# Patient Record
Sex: Female | Born: 1935 | Race: White | Hispanic: No | Marital: Married | Smoking: Former smoker
Health system: Southern US, Community
[De-identification: ages and names within clinical notes are randomized; demographics above are authoritative.]

## PROBLEM LIST (undated history)

## (undated) DIAGNOSIS — F02818 Dementia in other diseases classified elsewhere, unspecified severity, with other behavioral disturbance: Secondary | ICD-10-CM

## (undated) DIAGNOSIS — G309 Alzheimer's disease, unspecified: Secondary | ICD-10-CM

## (undated) DIAGNOSIS — F0280 Dementia in other diseases classified elsewhere without behavioral disturbance: Secondary | ICD-10-CM

## (undated) DIAGNOSIS — F0281 Dementia in other diseases classified elsewhere with behavioral disturbance: Secondary | ICD-10-CM

## (undated) DIAGNOSIS — R609 Edema, unspecified: Secondary | ICD-10-CM

## (undated) DIAGNOSIS — F329 Major depressive disorder, single episode, unspecified: Secondary | ICD-10-CM

## (undated) DIAGNOSIS — I509 Heart failure, unspecified: Secondary | ICD-10-CM

## (undated) DIAGNOSIS — I10 Essential (primary) hypertension: Secondary | ICD-10-CM

## (undated) DIAGNOSIS — F32A Depression, unspecified: Secondary | ICD-10-CM

## (undated) HISTORY — DX: Heart failure, unspecified: I50.9

## (undated) HISTORY — DX: Alzheimer's disease, unspecified: G30.9

## (undated) HISTORY — DX: Dementia in other diseases classified elsewhere, unspecified severity, without behavioral disturbance, psychotic disturbance, mood disturbance, and anxiety: F02.80

## (undated) HISTORY — DX: Depression, unspecified: F32.A

## (undated) HISTORY — DX: Edema, unspecified: R60.9

## (undated) HISTORY — DX: Essential (primary) hypertension: I10

## (undated) HISTORY — DX: Dementia in other diseases classified elsewhere with behavioral disturbance: F02.81

## (undated) HISTORY — DX: Major depressive disorder, single episode, unspecified: F32.9

## (undated) HISTORY — DX: Dementia in other diseases classified elsewhere, unspecified severity, with other behavioral disturbance: F02.818

---

## 1999-11-23 ENCOUNTER — Encounter (HOSPITAL_BASED_OUTPATIENT_CLINIC_OR_DEPARTMENT_OTHER): Payer: Self-pay | Admitting: Internal Medicine

## 1999-11-23 ENCOUNTER — Encounter: Admission: RE | Admit: 1999-11-23 | Discharge: 1999-11-23 | Payer: Self-pay | Admitting: Internal Medicine

## 2001-06-12 ENCOUNTER — Encounter: Admission: RE | Admit: 2001-06-12 | Discharge: 2001-06-12 | Payer: Self-pay | Admitting: Internal Medicine

## 2001-06-12 ENCOUNTER — Encounter: Payer: Self-pay | Admitting: Internal Medicine

## 2001-12-11 ENCOUNTER — Encounter (INDEPENDENT_AMBULATORY_CARE_PROVIDER_SITE_OTHER): Payer: Self-pay | Admitting: *Deleted

## 2001-12-11 ENCOUNTER — Encounter: Payer: Self-pay | Admitting: Internal Medicine

## 2001-12-11 ENCOUNTER — Ambulatory Visit (HOSPITAL_COMMUNITY): Admission: RE | Admit: 2001-12-11 | Discharge: 2001-12-11 | Payer: Self-pay | Admitting: Internal Medicine

## 2002-08-13 ENCOUNTER — Encounter: Admission: RE | Admit: 2002-08-13 | Discharge: 2002-08-13 | Payer: Self-pay | Admitting: Internal Medicine

## 2002-08-13 ENCOUNTER — Encounter: Payer: Self-pay | Admitting: Internal Medicine

## 2002-11-14 ENCOUNTER — Inpatient Hospital Stay (HOSPITAL_COMMUNITY): Admission: EM | Admit: 2002-11-14 | Discharge: 2002-11-16 | Payer: Self-pay | Admitting: Emergency Medicine

## 2002-11-14 ENCOUNTER — Encounter: Payer: Self-pay | Admitting: Emergency Medicine

## 2002-11-15 ENCOUNTER — Encounter: Payer: Self-pay | Admitting: Internal Medicine

## 2003-06-16 ENCOUNTER — Ambulatory Visit (HOSPITAL_COMMUNITY): Admission: RE | Admit: 2003-06-16 | Discharge: 2003-06-16 | Payer: Self-pay | Admitting: Internal Medicine

## 2003-09-09 ENCOUNTER — Encounter: Admission: RE | Admit: 2003-09-09 | Discharge: 2003-09-09 | Payer: Self-pay | Admitting: Internal Medicine

## 2004-11-09 ENCOUNTER — Encounter: Admission: RE | Admit: 2004-11-09 | Discharge: 2004-11-09 | Payer: Self-pay | Admitting: Internal Medicine

## 2005-09-13 ENCOUNTER — Emergency Department (HOSPITAL_COMMUNITY): Admission: EM | Admit: 2005-09-13 | Discharge: 2005-09-13 | Payer: Self-pay | Admitting: Emergency Medicine

## 2005-11-11 ENCOUNTER — Encounter: Admission: RE | Admit: 2005-11-11 | Discharge: 2005-11-11 | Payer: Self-pay | Admitting: Internal Medicine

## 2006-11-15 ENCOUNTER — Encounter: Admission: RE | Admit: 2006-11-15 | Discharge: 2006-11-15 | Payer: Self-pay | Admitting: Internal Medicine

## 2007-12-19 ENCOUNTER — Encounter: Admission: RE | Admit: 2007-12-19 | Discharge: 2007-12-19 | Payer: Self-pay | Admitting: Internal Medicine

## 2009-05-13 ENCOUNTER — Ambulatory Visit: Payer: Self-pay | Admitting: Internal Medicine

## 2009-05-13 ENCOUNTER — Telehealth: Payer: Self-pay | Admitting: Internal Medicine

## 2009-05-13 DIAGNOSIS — Z860100 Personal history of colon polyps, unspecified: Secondary | ICD-10-CM | POA: Insufficient documentation

## 2009-05-13 DIAGNOSIS — D509 Iron deficiency anemia, unspecified: Secondary | ICD-10-CM

## 2010-06-03 ENCOUNTER — Ambulatory Visit: Payer: Self-pay | Admitting: Cardiology

## 2010-06-03 ENCOUNTER — Inpatient Hospital Stay (HOSPITAL_COMMUNITY): Admission: EM | Admit: 2010-06-03 | Discharge: 2010-06-10 | Payer: Self-pay | Admitting: Emergency Medicine

## 2010-06-04 ENCOUNTER — Encounter (INDEPENDENT_AMBULATORY_CARE_PROVIDER_SITE_OTHER): Payer: Self-pay | Admitting: Family Medicine

## 2010-06-08 DIAGNOSIS — F0281 Dementia in other diseases classified elsewhere with behavioral disturbance: Secondary | ICD-10-CM

## 2010-08-30 ENCOUNTER — Encounter: Payer: Self-pay | Admitting: Internal Medicine

## 2010-10-19 LAB — CBC
HCT: 34.1 % — ABNORMAL LOW (ref 36.0–46.0)
MCHC: 34.3 g/dL (ref 30.0–36.0)
MCV: 95.9 fL (ref 78.0–100.0)
RDW: 16.5 % — ABNORMAL HIGH (ref 11.5–15.5)

## 2010-10-19 LAB — BASIC METABOLIC PANEL
BUN: 14 mg/dL (ref 6–23)
Chloride: 101 mEq/L (ref 96–112)
Glucose, Bld: 95 mg/dL (ref 70–99)
Potassium: 3.8 mEq/L (ref 3.5–5.1)

## 2010-10-20 LAB — BLOOD GAS, ARTERIAL
Acid-Base Excess: 8.2 mmol/L — ABNORMAL HIGH (ref 0.0–2.0)
Bicarbonate: 32.8 mEq/L — ABNORMAL HIGH (ref 20.0–24.0)
O2 Saturation: 93.8 %
pO2, Arterial: 62.9 mmHg — ABNORMAL LOW (ref 80.0–100.0)

## 2010-10-20 LAB — TSH: TSH: 0.067 u[IU]/mL — ABNORMAL LOW (ref 0.350–4.500)

## 2010-10-20 LAB — BASIC METABOLIC PANEL
CO2: 31 mEq/L (ref 19–32)
CO2: 32 mEq/L (ref 19–32)
CO2: 33 mEq/L — ABNORMAL HIGH (ref 19–32)
CO2: 35 mEq/L — ABNORMAL HIGH (ref 19–32)
Calcium: 8 mg/dL — ABNORMAL LOW (ref 8.4–10.5)
Calcium: 8.1 mg/dL — ABNORMAL LOW (ref 8.4–10.5)
Calcium: 8.6 mg/dL (ref 8.4–10.5)
Chloride: 106 mEq/L (ref 96–112)
Chloride: 106 mEq/L (ref 96–112)
Chloride: 108 mEq/L (ref 96–112)
Creatinine, Ser: 0.73 mg/dL (ref 0.4–1.2)
Creatinine, Ser: 0.75 mg/dL (ref 0.4–1.2)
Creatinine, Ser: 0.79 mg/dL (ref 0.4–1.2)
GFR calc Af Amer: >60 mL/min (ref >60)
GFR calc Af Amer: >60 mL/min (ref >60)
GFR calc Af Amer: >60 mL/min (ref >60)
GFR calc Af Amer: >60 mL/min (ref >60)
GFR calc Af Amer: >60 mL/min (ref >60)
GFR calc non Af Amer: >60 mL/min (ref >60)
Potassium: 4 mEq/L (ref 3.5–5.1)
Potassium: 4.1 mEq/L (ref 3.5–5.1)
Sodium: 141 mEq/L (ref 135–145)
Sodium: 143 mEq/L (ref 135–145)
Sodium: 144 mEq/L (ref 135–145)

## 2010-10-20 LAB — CBC
HCT: 35.4 % — ABNORMAL LOW (ref 36.0–46.0)
Hemoglobin: 11.8 g/dL — ABNORMAL LOW (ref 12.0–15.0)
Hemoglobin: 12 g/dL (ref 12.0–15.0)
Hemoglobin: 12.1 g/dL (ref 12.0–15.0)
Hemoglobin: 12.7 g/dL (ref 12.0–15.0)
MCH: 32.6 pg (ref 26.0–34.0)
MCH: 32.6 pg (ref 26.0–34.0)
MCH: 32.8 pg (ref 26.0–34.0)
MCH: 33 pg (ref 26.0–34.0)
MCV: 96.4 fL (ref 78.0–100.0)
MCV: 96.6 fL (ref 78.0–100.0)
Platelets: 241 10*3/uL (ref 150–400)
Platelets: 265 10*3/uL (ref 150–400)
Platelets: 280 10*3/uL (ref 150–400)
Platelets: 281 10*3/uL (ref 150–400)
RBC: 3.58 MIL/uL — ABNORMAL LOW (ref 3.87–5.11)
RBC: 3.64 MIL/uL — ABNORMAL LOW (ref 3.87–5.11)
RBC: 3.68 MIL/uL — ABNORMAL LOW (ref 3.87–5.11)
RBC: 3.71 MIL/uL — ABNORMAL LOW (ref 3.87–5.11)
RBC: 3.87 MIL/uL (ref 3.87–5.11)
RDW: 16.4 % — ABNORMAL HIGH (ref 11.5–15.5)
WBC: 10.1 10*3/uL (ref 4.0–10.5)
WBC: 11.8 10*3/uL — ABNORMAL HIGH (ref 4.0–10.5)
WBC: 21.4 10*3/uL — ABNORMAL HIGH (ref 4.0–10.5)

## 2010-10-20 LAB — PROTIME-INR
INR: 1.27 (ref 0.00–1.49)
INR: 1.38 (ref 0.00–1.49)
Prothrombin Time: 16.1 seconds — ABNORMAL HIGH (ref 11.6–15.2)
Prothrombin Time: 17.2 seconds — ABNORMAL HIGH (ref 11.6–15.2)

## 2010-10-20 LAB — DIFFERENTIAL
Basophils Absolute: 0 10*3/uL (ref 0.0–0.1)
Eosinophils Absolute: 0 10*3/uL (ref 0.0–0.7)
Lymphocytes Relative: 4 % — ABNORMAL LOW (ref 12–46)
Monocytes Absolute: 1.7 10*3/uL — ABNORMAL HIGH (ref 0.1–1.0)
Neutrophils Relative %: 88 % — ABNORMAL HIGH (ref 43–77)

## 2010-10-20 LAB — BRAIN NATRIURETIC PEPTIDE
Pro B Natriuretic peptide (BNP): 108 pg/mL — ABNORMAL HIGH (ref 0.0–100.0)
Pro B Natriuretic peptide (BNP): 321 pg/mL — ABNORMAL HIGH (ref 0.0–100.0)

## 2010-10-20 LAB — CULTURE, BLOOD (ROUTINE X 2): Culture  Setup Time: 201110280206

## 2010-10-20 LAB — POCT CARDIAC MARKERS
CKMB, poc: 3.2 ng/mL (ref 1.0–8.0)
Myoglobin, poc: 72.4 ng/mL (ref 12–200)
Troponin i, poc: <0.05 ng/mL (ref 0.00–0.09)

## 2010-10-20 LAB — COMPREHENSIVE METABOLIC PANEL
AST: 16 U/L (ref 0–37)
Albumin: 2.3 g/dL — ABNORMAL LOW (ref 3.5–5.2)
Calcium: 8.1 mg/dL — ABNORMAL LOW (ref 8.4–10.5)
Chloride: 105 mEq/L (ref 96–112)
Creatinine, Ser: 0.78 mg/dL (ref 0.4–1.2)
GFR calc Af Amer: >60 mL/min (ref >60)
Total Protein: 5.6 g/dL — ABNORMAL LOW (ref 6.0–8.3)

## 2010-10-20 LAB — POCT I-STAT, CHEM 8
Chloride: 95 mEq/L — ABNORMAL LOW (ref 96–112)
Creatinine, Ser: 1.1 mg/dL (ref 0.4–1.2)
Glucose, Bld: 94 mg/dL (ref 70–99)
HCT: 41 % (ref 36.0–46.0)
Potassium: 2.7 mEq/L — CL (ref 3.5–5.1)
Sodium: 140 mEq/L (ref 135–145)

## 2010-10-20 LAB — MAGNESIUM: Magnesium: 2.2 mg/dL (ref 1.5–2.5)

## 2010-10-20 LAB — PROCALCITONIN: Procalcitonin: 0.24 ng/mL

## 2010-12-24 NOTE — H&P (Signed)
NAME:  PARI, LOMBARD NO.:  [id number]   MEDICAL RECORD NO.:  [id number]                   PATIENT TYPE:  EMS   LOCATION:  [facility]                                 FACILITY:  [facility]   PHYSICIAN:  Gwen Pounds, M.D.                 DATE OF BIRTH:  1926/10/02   DATE OF ADMISSION:  11/14/2002  DATE OF DISCHARGE:                                HISTORY & PHYSICAL   CHIEF COMPLAINT:  Shortness of breath and tachypnea.   HISTORY OF PRESENT ILLNESS:  [age]-year-old female with history of  pneumonia and a greater than 50-pack-year tobacco history, developed cough,  upper respiratory infection over the last couple of days, no sputum, and  just got progressively short of breath over the course of this afternoon.  She described chest congestion and rattly airway noises and shortness of  breath.  It got worse approximately 10:30 p.m.  She went in the bathroom,  closed the door, turned the shower on and took the steam and felt like her  breathing was getting slightly better.  They called me around 12:00 or 12:30  with the above-mentioned symptoms and I instructed them to call 911.  EMS  came out, noticed that she was hypoxic, placed her on oxygen and brought her  to the emergency department.  In the ED, she was diagnosed with COPD versus  early pneumonia.  Chest x-ray revealed infiltrate versus atelectasis.  Oxygen saturations were 74% on arrival.  Her face mask is currently off and  lying by her cheek and her oxygen saturation in the mid-80s.  She denies any  other medical issues at the current time.  I was called for admission.   PAST MEDICAL HISTORY:  Hyperlipidemia, hypertension, anxiety, depression,  osteoporosis and history of pneumonia.   MEDICATIONS:  Medications include Lipitor, Cozaar, Zoloft, triazolam and  Fosamax.   ALLERGIES:  MORPHINE and CODEINE.   SOCIAL HISTORY:  No tobacco in the last 10 years but has greater than a 50-  year tobacco  history.  She lives with her husband.   FAMILY HISTORY:  Mother died of subarachnoid hemorrhage.  Father died of war  wounds.   REVIEW OF SYSTEMS:  She denies any chest pain.  Her shortness of breath is  slightly better now after getting some O2 but she is still labored.  She  initially says that she had been having orthopnea, dyspnea on exertion, no  abdominal pain, no trouble with her bowels or bladder, no trouble with ear,  nose and throat, no other chest or lung issues, no other cardiac issues, no  trouble walking or talking, no other issues from head to toe.   PHYSICAL EXAMINATION:  VITALS:  Temperature 99.9, heart rate 98, blood  pressure 170/73, respiratory rate 28, room air saturations 74%, 86% after  wearing oxygen for a little while.  GENERAL:  Mild  respiratory distress, sitting up with tachypnea and some  labored breathing, using accessory muscles to breathe.  EENT:  PERRL.  EOMI.  Oropharynx moist.  NECK:  Neck without JVD.  No lymphadenopathy noted.  CARDIAC:  Regular without murmurs.  PULMONARY:  Diffuse rhonchi and wheezing throughout, audible from across the  room.  She is pretty tachypneic.  ABDOMEN:  Abdomen is soft.  EXTREMITIES:  Trace edema and varicose veins.   ANCILLARY DATA:  EKG shows normal sinus rhythm, first-degree A-V block and  artifact noted.   Chest x-ray:  Right lower lobe atelectasis versus infiltrate.   PCO2 65, PO2 116, pH of 7.32, saturating 98% on non-breather mask.  Sodium  140, potassium 3.2, chloride 105, bicarb 30, BUN 18, creatinine 0.6, glucose  120.  Her LFTs are fine.  WBC count is 14.1, hemoglobin 14.1, platelet count  191,000.  CK 43, troponin I 0.02, BNP 43.   ASSESSMENT:  This is an elderly female being admitted with a chronic  obstructive pulmonary disease exacerbation or pneumonia and hypoxic  respiratory failure.   PLAN:  1. Admit.  2. Oxygen to keep the oxygen saturations greater than 92%.  3. Nebulizers and antibiotics.   See order sheet.  4. We will place her on IV, then oral steroids for the COPD exacerbation.  5. We will treat underlying hypertension.  We will increase the Cozaar at     this current time.  6. We will treat the underlying hypokalemia with IV potassium in her normal     saline.  7. We will place on DVT prophylaxis.  8. We will check CBGs on a twice-daily basis secondary to the high steroids     and I will instruct the nurses to call for sugars greater than 200 and to     be started on insulin sliding scale.  9. First CK and troponins are negative.  I doubt this is cardiac in nature,     but we will check one more CK and troponin at this current time.                                               Gwen Pounds, M.D.    JMR/MEDQ  D:  11/14/2002  T:  11/15/2002  Job:  [id number]   cc:   Gaspar Garbe, M.D.  [address]  [place]  [place] [address]  Fax: [phone]

## 2010-12-24 NOTE — Discharge Summary (Signed)
NAME:  Patricia Roberson, FEHRING                             ACCOUNT NO.:  [id number]   MEDICAL RECORD NO.:  [id number]                   PATIENT TYPE:  INP   LOCATION:  4732                                 FACILITY:  [facility]   PHYSICIAN:  Gaspar Garbe, M.D.            DATE OF BIRTH:  [birth year]   DATE OF ADMISSION:  11/14/2002  DATE OF DISCHARGE:  11/16/2002                                 DISCHARGE SUMMARY   DIAGNOSES:  1. Chronic obstructive pulmonary disease exacerbation.  2. Abnormal cardiac enzymes.  3. Hypertension.  4. Hyperlipidemia.   MEDICATIONS:  1. Lipitor 20 mg 1/2 tablet daily.  2. Cozaar 50 mg p.o. daily.  3. Zoloft 50 mg p.o. daily.  4. Halcion p.r.n. #30.  5. Fosamax 70 mg p.o. weekly.   NEW MEDICATIONS:  1. Cannula oxygen at 2 L a minute.  2. Prednisone 20 mg 2 tablets x4 days and 1 tablet x6 days.  3. DuoNeb q.i.d.  4. Augmentin 875 mg p.o. b.i.d. x10 total days.  5. Over-the-counter Prilosec 20 mg daily x2 weeks.   HOSPITAL COURSE:  The patient was admitted on November 14, 2002 and found to be  in acute respiratory distress with oxygen saturations in the low 90s on 4-5  L.  The patient was given around-the-clock nebulizers every four hours and  started on Solu-Medrol IV after being seen by Dr. Timothy Lasso in the emergency  room.  In addition, the patient was put on Rocephin and azithromycin, which  was continued during the course of her hospital stay.  The patient was seen  and thought to be breathing considerably better the morning of April 9.  Her  prednisone was continued and her breathing treatments were changed to p.r.n.  only.  The patient continued to have a bit of an oxygen requirement on 2 L  which continued during the rest of the following day.  The patient was also  found to have a troponin at 0.2 and stayed between 0.1 and 0.12 after being  negative in the emergency room.  The patient was consulted by Dr. Ronny Flurry, who elected to see the patient as an  outpatient for adenosine  Cardiolite, as her COPD exacerbation was much more problematic at that time.   SPECIAL INSTRUCTIONS:  The patient is to have home health R.N. see her  regarding evaluation of pneumonia and COPD.  She was given an appointment  within one week of her discharge with Dr. Wylene Simmer and is to call Dr.  Yevonne Pax office after her visit with Dr. Wylene Simmer to arrange for an  adenosine Cardiolite the week of November 25, 2002.    DISCHARGE LABORATORY DATA:  White count 12.7, hemoglobin 12, hematocrit 36,  platelets 211.  D-dimer was negative.  The patient had a CT scan of her  chest which did not show any evidence of pulmonary embolus.  Her BUN and  creatinine were 13 and 0.5 at the time of discharge.                                               Gaspar Garbe, M.D.    RWT/MEDQ  D:  11/18/2002  T:  11/18/2002  Job:  [id number]

## 2010-12-24 NOTE — Consult Note (Signed)
NAME:  Patricia Roberson, Patricia Roberson NO.:  [id number]   MEDICAL RECORD NO.:  [id number]                   PATIENT TYPE:  INP   LOCATION:  4732                                 FACILITY:  [facility]   PHYSICIAN:  Cassell Clement, M.D.              DATE OF BIRTH:  06-10-1927   DATE OF CONSULTATION:  11/15/2002  DATE OF DISCHARGE:                                   CONSULTATION   CARDIOLOGY CONSULTATION   HISTORY OF PRESENT ILLNESS:  This is a 77 year old married Caucasian woman  admitted with marked dyspnea and wheezing on the morning of November 14, 2002.  This was not associated with any chills, fever or purulent sputum.  She did  not have any hemoptysis.  She had no chest pain or pleuritic pain.  She was  noted to be wheezing audibly from across the room on admission and was quite  hypoxic and in respiratory distress.  Interestingly she has had similar  acute respiratory problems in two previous years requiring admission  elsewhere while visiting her son on 67.  The patient's hospital  course so far has been smooth.  She has improved rapidly with pulmonary  toilet, nebulizers, antibiotics and steroids.  Her electrocardiogram's have  shown poor R wave progression but no evidence of any ischemic ST-T  abnormalities.  She has risk factors for coronary disease including prior  history of hypertension, prior history of hypercholesterolemia, prior  smoking history.  She gets very little exercise and is quite sedentary.   FAMILY HISTORY:  Negative for premature coronary artery disease.  Her father  died in World War II in combat and her mother died of a massive cerebral  hemorrhage.   MEDICATIONS AT HOME:  Include Lipitor, Cozaar, Zoloft, triazolam and  Fosamax.   PHYSICAL EXAMINATION:  VITAL SIGNS:  Examination reveals blood pressure  114/48, pulse 72 and regular, respirations are normal and she is no longer  requiring oxygen.  HEENT:  Head and neck examination  reveals the jugular venous pressure is  normal .  The carotids are normal.  CHEST:  Clear.  HEART:  Regular sinus rhythm without murmurs, gallops, rubs or clicks.  ABDOMEN:  Soft, nontender.  EXTREMITIES:  Show 1+ pedal pulses, no phlebitis or edema.   LABORATORY DATA:  Her labs include troponin's of 0.02, 0.21, 0.10 and 0.12.  Her CK-MB's are 2.2 and 4.1.  Her electrocardiogram shows no acute change.   IMPRESSION:  I suspect that the slight rise in cardiac enzymes represent a  false positive enzyme rise secondary to her acute exacerbation of chronic  obstructive pulmonary disease with asthma.  I don't find any evidence of any  myocardial injury as a primary event.   RECOMMENDATIONS:  Recommend completion of work up with the spiral CT scan of  the chest to rule out pulmonary emboli as you have ordered.  I don't believe  any further inpatient cardiac tests are necessary.  She should have an  outpatient adenosine Cardiolite stress test which we can do in the office  once she has cleared her acute  respiratory exacerbation further.  I would anticipate we could probably do  the adenosine Cardiolite stress test the week of November 25, 2002.  She will  call our office after her discharge to arrange this.   Thank you for asking me to see this pleasant woman with you.                                               Cassell Clement, M.D.    TB/MEDQ  D:  11/15/2002  T:  11/15/2002  Job:  [id number]

## 2011-03-05 ENCOUNTER — Emergency Department (HOSPITAL_COMMUNITY): Payer: Medicare Other

## 2011-03-05 ENCOUNTER — Inpatient Hospital Stay (HOSPITAL_COMMUNITY)
Admission: EM | Admit: 2011-03-05 | Discharge: 2011-03-07 | DRG: 087 | Disposition: A | Discharge status: Skilled Nursing Facility | Payer: Medicare Other | Attending: General Surgery | Admitting: General Surgery

## 2011-03-05 DIAGNOSIS — S0100XA Unspecified open wound of scalp, initial encounter: Secondary | ICD-10-CM

## 2011-03-05 DIAGNOSIS — W19XXXA Unspecified fall, initial encounter: Secondary | ICD-10-CM | POA: Diagnosis present

## 2011-03-05 DIAGNOSIS — F329 Major depressive disorder, single episode, unspecified: Secondary | ICD-10-CM | POA: Diagnosis present

## 2011-03-05 DIAGNOSIS — J449 Chronic obstructive pulmonary disease, unspecified: Secondary | ICD-10-CM | POA: Diagnosis present

## 2011-03-05 DIAGNOSIS — I10 Essential (primary) hypertension: Secondary | ICD-10-CM | POA: Diagnosis present

## 2011-03-05 DIAGNOSIS — J4489 Other specified chronic obstructive pulmonary disease: Secondary | ICD-10-CM | POA: Diagnosis present

## 2011-03-05 DIAGNOSIS — F0280 Dementia in other diseases classified elsewhere without behavioral disturbance: Secondary | ICD-10-CM | POA: Diagnosis present

## 2011-03-05 DIAGNOSIS — G309 Alzheimer's disease, unspecified: Secondary | ICD-10-CM | POA: Diagnosis present

## 2011-03-05 DIAGNOSIS — R319 Hematuria, unspecified: Secondary | ICD-10-CM | POA: Diagnosis present

## 2011-03-05 DIAGNOSIS — Y9210 Unspecified residential institution as the place of occurrence of the external cause: Secondary | ICD-10-CM | POA: Diagnosis present

## 2011-03-05 DIAGNOSIS — F3289 Other specified depressive episodes: Secondary | ICD-10-CM | POA: Diagnosis present

## 2011-03-05 DIAGNOSIS — S069X9A Unspecified intracranial injury with loss of consciousness of unspecified duration, initial encounter: Secondary | ICD-10-CM

## 2011-03-05 DIAGNOSIS — D649 Anemia, unspecified: Secondary | ICD-10-CM | POA: Diagnosis present

## 2011-03-05 DIAGNOSIS — N200 Calculus of kidney: Secondary | ICD-10-CM | POA: Diagnosis present

## 2011-03-05 DIAGNOSIS — S066X0A Traumatic subarachnoid hemorrhage without loss of consciousness, initial encounter: Principal | ICD-10-CM | POA: Diagnosis present

## 2011-03-05 DIAGNOSIS — M1990 Unspecified osteoarthritis, unspecified site: Secondary | ICD-10-CM | POA: Diagnosis present

## 2011-03-05 LAB — COMPREHENSIVE METABOLIC PANEL
ALT: 11 U/L (ref 0–35)
AST: 13 U/L (ref 0–37)
Calcium: 10 mg/dL (ref 8.4–10.5)
Creatinine, Ser: 0.77 mg/dL (ref 0.50–1.10)
GFR calc Af Amer: >60 mL/min (ref >60)
Glucose, Bld: 100 mg/dL — ABNORMAL HIGH (ref 70–99)
Sodium: 139 mEq/L (ref 135–145)
Total Protein: 6.6 g/dL (ref 6.0–8.3)

## 2011-03-05 LAB — DIFFERENTIAL
Eosinophils Absolute: 0.1 10*3/uL (ref 0.0–0.7)
Eosinophils Relative: 1 % (ref 0–5)
Lymphocytes Relative: 10 % — ABNORMAL LOW (ref 12–46)
Lymphs Abs: 1.3 10*3/uL (ref 0.7–4.0)
Monocytes Absolute: 0.8 10*3/uL (ref 0.1–1.0)
Monocytes Relative: 6 % (ref 3–12)

## 2011-03-05 LAB — CK TOTAL AND CKMB (NOT AT ARMC)
CK, MB: 1.9 ng/mL (ref 0.3–4.0)
Relative Index: INVALID (ref 0.0–2.5)

## 2011-03-05 LAB — TROPONIN I: Troponin I: <0.3 ng/mL (ref <0.30)

## 2011-03-05 LAB — CBC
HCT: 40.9 % (ref 36.0–46.0)
MCH: 31.7 pg (ref 26.0–34.0)
MCV: 95.3 fL (ref 78.0–100.0)
RDW: 16.6 % — ABNORMAL HIGH (ref 11.5–15.5)
WBC: 13.1 10*3/uL — ABNORMAL HIGH (ref 4.0–10.5)

## 2011-03-05 LAB — URINE MICROSCOPIC-ADD ON

## 2011-03-05 LAB — URINALYSIS, ROUTINE W REFLEX MICROSCOPIC
Bilirubin Urine: NEGATIVE
Nitrite: NEGATIVE
Protein, ur: NEGATIVE mg/dL
Specific Gravity, Urine: 1.023 (ref 1.005–1.030)
Urobilinogen, UA: 0.2 mg/dL (ref 0.0–1.0)

## 2011-03-05 LAB — APTT: aPTT: 27 seconds (ref 24–37)

## 2011-03-06 ENCOUNTER — Inpatient Hospital Stay (HOSPITAL_COMMUNITY): Payer: Medicare Other

## 2011-03-06 LAB — BASIC METABOLIC PANEL
BUN: 17 mg/dL (ref 6–23)
CO2: 30 mEq/L (ref 19–32)
Calcium: 9.2 mg/dL (ref 8.4–10.5)
Creatinine, Ser: 0.75 mg/dL (ref 0.50–1.10)
GFR calc non Af Amer: >60 mL/min (ref >60)
Glucose, Bld: 77 mg/dL (ref 70–99)

## 2011-03-06 LAB — CBC
HCT: 35.7 % — ABNORMAL LOW (ref 36.0–46.0)
Hemoglobin: 11.8 g/dL — ABNORMAL LOW (ref 12.0–15.0)
MCH: 31.6 pg (ref 26.0–34.0)
MCHC: 33.1 g/dL (ref 30.0–36.0)
MCV: 95.5 fL (ref 78.0–100.0)
RBC: 3.74 MIL/uL — ABNORMAL LOW (ref 3.87–5.11)

## 2011-03-06 MED ORDER — IOHEXOL 300 MG/ML  SOLN
120.0000 mL | Freq: Once | INTRAMUSCULAR | Status: AC | PRN
  Administered 2011-03-06: 120 mL via INTRAVENOUS

## 2011-03-07 ENCOUNTER — Inpatient Hospital Stay (HOSPITAL_COMMUNITY): Payer: Medicare Other

## 2011-03-07 LAB — CBC
MCH: 31.2 pg (ref 26.0–34.0)
MCHC: 32.8 g/dL (ref 30.0–36.0)
MCV: 95.3 fL (ref 78.0–100.0)
Platelets: 204 10*3/uL (ref 150–400)

## 2011-03-13 NOTE — Discharge Summary (Signed)
NAMEMarland Kitchen  DENE, LANDSBERG NO.:  [id number]  MEDICAL RECORD NO.:  [id number]  LOCATION:  3033                         FACILITY:  [facility]  PHYSICIAN:  Cherylynn Ridges, M.D.    DATE OF BIRTH:  [birth year]  DATE OF ADMISSION:  03/05/2011 DATE OF DISCHARGE:  03/07/2011                        DISCHARGE SUMMARY - REFERRING   DISCHARGE DIAGNOSES: 1. Fall. 2. Traumatic brain injury with subarachnoid hemorrhage. 3. Right parietal scalp laceration. 4. Hypertension. 5. Chronic obstructive pulmonary disease. 6. Alzheimer disease. 7. Anemia. 8. Depression. 9. Osteoarthritis. 10.Hematuria. 11.Left renal pelvis calculus.  CONSULTANTS:  Henry A. Pool, MD, for Neurosurgery.  PROCEDURE:  Closure of scalp laceration by emergency department staff.  HISTORY OF PRESENT ILLNESS:  This is an 77 year old white female who is a resident in the skilled nursing facility of [facility] [address] when she had an unwitnessed fall in the bathroom.  She had a scalp laceration on the right side and was brought into the emergency department.  Head CT showed a mild subarachnoid hemorrhage.  She appeared to be baseline neurologically and was admitted to the Intensive Care Unit for monitoring.  She had an incidental hematuria noted on her admission urinalysis.  HOSPITAL COURSE:  Her repeat head CT the following morning was improved. She was mobilized with physical and occupational therapy and did well and appeared to be back at baseline.  She was returning to her SNF level of care and so that was not a problem.  We went ahead and did a CT the abdomen and pelvis to see if there was any traumatic etiology for her hematuria.  She did have a large left renal pelvic calculus, but no obvious trauma.  She remained stable throughout her hospital stay and was able to be discharged back to the skilled nursing facility in stable condition.  DISCHARGE MEDICATIONS:  Norco 5/325 take 1 p.o. q.4 h. p.r.n. pain,  #12 was given as a courtesy to the facility with no refill.  In addition, she may resume her home medications.  These include: 1. Abilify 2.5 mg daily. 2. Advair 250/50 one puff twice daily. 3. Atorvastatin 5 mg every evening. 4. Bupropion 100 mg 3 times daily. 5. Donepezil 5 mg nightly. 6. Fosamax 70 mg weekly. 7. Lasix 40 mg daily. 8. Losartan 25 mg daily. 9. Metoprolol 1/4 tablet twice daily. 10.Namenda 10 mg twice daily. 11.Os-Cal 500 twice daily. 12.MiraLax 17 g daily. 13.Spironolactone 50 mg every morning. 14.Trazodone 50 mg at bedtime. 15.Vitamin D3 1000 international units every morning.  FOLLOWUP:  The patient may follow up with her primary care provider as needed.  She will need to have her staples removed sometime between March 10, 2011, and March 12, 2011.  The facility should feel comfortable removing those, but if they have questions they may call the Trauma Service for guidance or to get her appointment.  She will need to have a consultation by Urology for her hematuria.  The facility and/or the patient's primary care provider should arrange that.  Otherwise followup with both Neurosurgery and Trauma Surgery will be on an as- needed basis.     Earney Hamburg, P.A.   ______________________________ Fayrene Fearing  Charlsie Quest, M.D.    MJ/MEDQ  D:  03/07/2011  T:  03/07/2011  Job:  [id number]  cc:   Henry A. Pool, M.D.  Electronically Signed by Charma Igo P.A. on 03/09/2011 04:15:09 PM Electronically Signed by Jimmye Norman M.D. on 03/13/2011 06:30:44 AM

## 2011-03-14 NOTE — H&P (Signed)
NAMEMarland Roberson  AILENE, ROYAL                   ACCOUNT NO.:  [id number]  MEDICAL RECORD NO.:  [id number]  LOCATION:  [facility]                         FACILITY:  [facility]  PHYSICIAN:  Gabrielle Dare. Janee Morn, M.D.DATE OF BIRTH:  06/30/1927  DATE OF ADMISSION:  03/05/2011 DATE OF DISCHARGE:                             HISTORY & PHYSICAL   CHIEF COMPLAINT:  Fall.  HISTORY OF PRESENT ILLNESS:  Ms. Patricia Roberson is an 77 year old white female who was a resident at a [facility] [address] who had an unwitnessed fall at the facility today.  She was brought to emergency department with right scalp laceration.  Further workup at the emergency department demonstrated subarachnoid hemorrhage found on head CT.  The patient has no memory of her event.  We are asked to see her for admission to the Trauma Service.  History is somewhat limited secondary to the patient's Alzheimer dementia.  PAST MEDICAL HISTORY: 1. Hypertension. 2. COPD. 3. Depression. 4. Osteoarthritis. 5. Anemia. 6. Alzheimer dementia.  PAST SURGICAL HISTORY:  Unknown.  SOCIAL HISTORY:  No smoking and no drinking.  She lives at [facility].  ALLERGIES: 1. CODEINE. 2. MORPHINE. 3. LEVAQUIN.  MEDICATIONS: 1. Lasix 40 mg daily. 2. Losartan 25 mg daily. 3. Abilify 2.5 mg daily. 4. Fosamax 70 mg weekly. 5. Advair Diskus 1 puff b.i.d. 6. Metoprolol 12.5 mg b.i.d. 7. Namenda 10 mg b.i.d. 8. Spironolactone 50 mg daily. 9. MiraLax p.r.n.  REVIEW OF SYSTEMS:  The patient is amnestic to the event.  For neurologic, she denies significant headache or dizziness.  Remainder of system review is limited due to the patient's Alzheimer disease.  For example, she feels that year is 1982, however, she denies gross musculoskeletal pain.  Denies chest pain and denies abdominal pain.  PHYSICAL EXAMINATION:  VITAL SIGNS:  Temperature 96.4, pulse 60, respirations 18, and blood pressure 122/55. HEENT:  She has a 4-cm scalp laceration closed with staples in the  right temporal region over scalp.  There is no significant underlying hematoma.  No active bleeding.  Eyes, pupils are equal and reactive. Ears, right ear canal has some blood in the external auditory canal that I suspect was run down from her scalp.  There is no hemotympanum bilaterally.  Face is symmetric and nontender. NECK:  Supple with no tenderness or masses.  There is no pain with active range of motion and her cervical collar was removed. PULMONARY:  Lungs are clear to auscultation.  Respiratory effort is moderate.  No wheezing is heard. CARDIOVASCULAR:  Heart is regular with no murmurs.  Impulse is vaguely palpable in the left chest. EXTREMITIES:  Moderate edema bilaterally in the lower extremities. ABDOMEN:  Soft and nontender.  Organomegaly is noted.  Bowel sounds are present.  No masses are felt.  Pelvis is stable anteriorly. MUSCULOSKELETAL:  She has a small skin tear in the dorsum of the left hand, otherwise no deformities.  Back has no midline tenderness. NEUROLOGIC:  GCS is 15, but the patient is amnestic to the event.  She has significant dementia, but does follow commands and move all 4 extremities with good strength.  LABORATORY STUDIES:  Sodium 139, potassium 4.0, chloride  102, CO2 of 29, BUN 23, creatinine 0.77, and glucose 100.  White blood cell count 13,100, hemoglobin 13.6, and platelets 240,000.  Troponin less than 0.3. INR 1.07.  Liver function tests within normal limits.  Chest x-ray shows cardiomegaly, vascular congestion, and atelectasis.  Left hand x-ray shows no fracture.  CT scan of the head shows small subarachnoid hemorrhage of the left vertex.  CT scan of cervical spine shows extensive degenerative changes, but no fractures.  IMPRESSION: 1. An 77 year old female status post fall with traumatic brain injury     with small subarachnoid hemorrhage. 2. Scalp laceration. 3. Dementia. 4. Hypertension. 5. Chronic obstructive pulmonary disease. 6.  Osteoarthritis.  PLAN:  Will be to admit to the Trauma Service and Neurosurgical Intensive Care Unit.  We will obtain neurosurgery consultation in the emergency room as contacted to Dr. Jordan Likes.  We will plan to follow up head CT in the morning.     Gabrielle Dare Janee Morn, M.D.     BET/MEDQ  D:  03/05/2011  T:  03/05/2011  Job:  [id number]  cc:   Lenon Curt. Chilton Si, M.D.  Electronically Signed by Violeta Gelinas M.D. on 03/14/2011 08:22:59 PM

## 2012-08-16 LAB — BASIC METABOLIC PANEL
Creatinine: 1 mg/dL (ref 0.5–1.1)
Potassium: 4.5 mmol/L (ref 3.4–5.3)
Sodium: 139 mmol/L (ref 137–147)

## 2012-08-16 LAB — TSH: TSH: 0.38 u[IU]/mL — AB (ref 0.41–5.90)

## 2012-08-16 LAB — HEPATIC FUNCTION PANEL
ALT: 8 U/L (ref 7–35)
AST: 10 U/L — AB (ref 13–35)

## 2012-08-16 LAB — LIPID PANEL
HDL: 47 mg/dL (ref 35–70)
Triglycerides: 39 mg/dL — AB (ref 40–160)

## 2012-08-16 LAB — CBC AND DIFFERENTIAL
Hemoglobin: 12.3 g/dL (ref 12.0–16.0)
WBC: 102 10^3/mL

## 2012-11-02 ENCOUNTER — Non-Acute Institutional Stay (SKILLED_NURSING_FACILITY): Payer: Medicare Other | Admitting: Nurse Practitioner

## 2012-11-02 DIAGNOSIS — F070 Personality change due to known physiological condition: Secondary | ICD-10-CM | POA: Insufficient documentation

## 2012-11-02 DIAGNOSIS — J4489 Other specified chronic obstructive pulmonary disease: Secondary | ICD-10-CM | POA: Insufficient documentation

## 2012-11-02 DIAGNOSIS — G4700 Insomnia, unspecified: Secondary | ICD-10-CM | POA: Insufficient documentation

## 2012-11-02 DIAGNOSIS — E559 Vitamin D deficiency, unspecified: Secondary | ICD-10-CM | POA: Insufficient documentation

## 2012-11-02 DIAGNOSIS — I10 Essential (primary) hypertension: Secondary | ICD-10-CM | POA: Insufficient documentation

## 2012-11-02 DIAGNOSIS — E785 Hyperlipidemia, unspecified: Secondary | ICD-10-CM | POA: Insufficient documentation

## 2012-11-02 DIAGNOSIS — I5031 Acute diastolic (congestive) heart failure: Secondary | ICD-10-CM

## 2012-11-02 DIAGNOSIS — F329 Major depressive disorder, single episode, unspecified: Secondary | ICD-10-CM | POA: Insufficient documentation

## 2012-11-02 DIAGNOSIS — R609 Edema, unspecified: Secondary | ICD-10-CM | POA: Insufficient documentation

## 2012-11-02 DIAGNOSIS — F0391 Unspecified dementia with behavioral disturbance: Secondary | ICD-10-CM | POA: Insufficient documentation

## 2012-11-02 DIAGNOSIS — M810 Age-related osteoporosis without current pathological fracture: Secondary | ICD-10-CM | POA: Insufficient documentation

## 2012-11-02 DIAGNOSIS — J449 Chronic obstructive pulmonary disease, unspecified: Secondary | ICD-10-CM | POA: Insufficient documentation

## 2012-11-02 DIAGNOSIS — J209 Acute bronchitis, unspecified: Secondary | ICD-10-CM | POA: Insufficient documentation

## 2012-11-02 NOTE — Progress Notes (Signed)
Subjective:    Patient ID: Patricia Roberson, female    DOB: 1927/05/12, 79 y.o.   MRN: [id number]  HPI  Worsened wheezing congested cough 10/27/12--Azithromycin, FloraStor, DuoNeb q4hr started 10/27/12, fell 10/28/12 w/o apparent injury. Afebrile, no change in appetite or sleep, O2 dependent.   Hx of COPD, takes Spiriva and Advair HHI--noted mild wheezes and non productive cough--better after Neb tx. CXR 08/14/12 no evidence of acute or residual infiltrate. BNP 88.08/16/12  268-VITAMIN D DEFICIENCY  on Vitamin D daily.   272.4-HYPERLIPIDEMIA The patient's most recent LDL is at goal. atorvastatin  296.20-DEPRESSION, ACUTE MAJOR  better-on Cymbalta 60mg  since 03/27/12  310.1-MEMORY DISTURBANCE, MILD  on Namenda and Aricept.   401.9-HTN UNSPECIFIED  controlled, off  losartan 09/14/12(low Bp and cough) and Metoprolol.   428.31-ACUTE DIASTOLIC HEART FAILURE  s/p Cardiology consult, on Lasix 40mg /Spironolactone 50mg , BNP 88.1 08/16/12  496-COPD  wheezing,  on Advair bid and Spiriva  733.00-OSTEOPOROSIS  Fosamax and Ca  780.52-INSOMNIA, UNSPECIFIED  better after Cymbalta was increased to 60mg  dialy 03/27/12 off Trazodone   782.3-EDEMA  Lasix  and spironolactone--no change.   Review of Systems  Constitutional: Positive for fatigue. Negative for fever, chills, diaphoresis and appetite change.  HENT: Positive for rhinorrhea. Negative for congestion, neck pain and neck stiffness.   Eyes: Negative.   Respiratory: Positive for cough, shortness of breath and wheezing.   Cardiovascular: Positive for leg swelling. Negative for chest pain.  Gastrointestinal: Negative.   Endocrine: Negative.   Genitourinary: Positive for frequency (leaking). Negative for dysuria, urgency, flank pain and pelvic pain.  Musculoskeletal: Positive for back pain and gait problem.  Skin: Negative for rash and wound.  Allergic/Immunologic: Negative.   Neurological: Negative for dizziness, tremors, speech difficulty, numbness and headaches.   Hematological: Negative.   Psychiatric/Behavioral: Positive for behavioral problems, confusion and agitation (resistence to person care occasionally. ). Negative for hallucinations and sleep disturbance. The patient is not nervous/anxious.        Objective:   Physical Exam  Constitutional: She appears well-developed and well-nourished. No distress.  HENT:  Head: Normocephalic and atraumatic.  Eyes: Conjunctivae and EOM are normal. Pupils are equal, round, and reactive to light.  Neck: Normal range of motion. Neck supple. No JVD present.  Cardiovascular: Normal rate, regular rhythm and normal heart sounds.   No murmur heard. Pulmonary/Chest: Effort normal. No respiratory distress. She has wheezes. She has rales.  Abdominal: Soft. Bowel sounds are normal. There is no tenderness.  Musculoskeletal: Normal range of motion. She exhibits no edema and no tenderness.  Lymphadenopathy:    She has no cervical adenopathy.  Neurological: She is alert. She has normal reflexes. She displays normal reflexes. No cranial nerve deficit. She exhibits normal muscle tone. Coordination normal.  Skin: Skin is warm and dry. No rash noted. No erythema.  Psychiatric: Her speech is normal. Her affect is blunt and inappropriate. She is agitated. Thought content is not paranoid and not delusional. Cognition and memory are impaired. She expresses impulsivity. She expresses no suicidal ideation.  Agitated when she is assisted with personal care          Assessment & Plan:   ANEMIA-IRON DEFICIENCY Resolved, Hgb 12.3 08/16/12, off Fe   COPD (chronic obstructive pulmonary disease) Frequent exacerbation.   . Bronchitis, acute, with bronchospasm Complete Azithromycin, observe.   Marland Kitchen Unspecified vitamin D deficiency Continue Vit D 1000 units daily.   . Other and unspecified hyperlipidemia Continue diet and Atorvastatin 5mg   . Major depressive disorder, single  episode, unspecified Better with Cymbalta.    Marland Kitchen Unspecified essential hypertension controlled  . Acute diastolic heart failure compensated  . Chronic airway obstruction, not elsewhere classified-chronic bronchodilator, frequent exacerbation  . Osteoporosis, unspecified Still at risk for falling, continue Ca and Alendronate.   . Insomnia, unspecified  Continue Cymbalta  . Edema Trace, continue Lasix and Spironolactone  . Dementia with behavioral disturbance Continue SNF for care and Aricept and Namenda.

## 2012-11-16 ENCOUNTER — Non-Acute Institutional Stay (SKILLED_NURSING_FACILITY): Payer: Medicare Other | Admitting: Nurse Practitioner

## 2012-11-16 DIAGNOSIS — E559 Vitamin D deficiency, unspecified: Secondary | ICD-10-CM

## 2012-11-16 DIAGNOSIS — I10 Essential (primary) hypertension: Secondary | ICD-10-CM

## 2012-11-16 DIAGNOSIS — F0391 Unspecified dementia with behavioral disturbance: Secondary | ICD-10-CM

## 2012-11-16 DIAGNOSIS — G4700 Insomnia, unspecified: Secondary | ICD-10-CM

## 2012-11-16 DIAGNOSIS — R609 Edema, unspecified: Secondary | ICD-10-CM

## 2012-11-16 DIAGNOSIS — E785 Hyperlipidemia, unspecified: Secondary | ICD-10-CM

## 2012-11-16 DIAGNOSIS — M810 Age-related osteoporosis without current pathological fracture: Secondary | ICD-10-CM

## 2012-11-16 DIAGNOSIS — J449 Chronic obstructive pulmonary disease, unspecified: Secondary | ICD-10-CM

## 2012-11-16 DIAGNOSIS — F329 Major depressive disorder, single episode, unspecified: Secondary | ICD-10-CM

## 2012-11-16 DIAGNOSIS — I5031 Acute diastolic (congestive) heart failure: Secondary | ICD-10-CM

## 2012-11-16 NOTE — Assessment & Plan Note (Signed)
Trace in ankles.  

## 2012-11-16 NOTE — Progress Notes (Signed)
Patient ID: Patricia Roberson, female   DOB: [birth year], 79 y.o.   MRN: [id number]  Chief Complaint:  Chief Complaint  Patient presents with  . Medical Managment of Chronic Issues     HPI:   VITAMIN D DEFICIENCY  on Vitamin D daily.   HYPERLIPIDEMIA The patient's most recent LDL is at goal. atorvastatin  DEPRESSION, ACUTE MAJOR  better-on Cymbalta 60mg  since 03/27/12  MEMORY DISTURBANCE, MILD  on Namenda and Aricept.   HTN UNSPECIFIED  controlled, off  losartan 09/14/12(low Bp and cough) and Metoprolol.   ACUTE DIASTOLIC HEART FAILURE  s/p Cardiology consult, on Lasix 40mg /Spironolactone 50mg , BNP 88.1 08/16/12  COPD  wheezing,  on Advair bid and Spiriva  OSTEOPOROSIS  Fosamax and Ca  INSOMNIA, UNSPECIFIED  better after Cymbalta was increased to 60mg  dialy 03/27/12 off Trazodone   EDEMA  Lasix  and spironolactone--no change.    Review of Systems:  Review of Systems  Constitutional: Negative for fever, chills, weight loss, malaise/fatigue and diaphoresis.  HENT: Positive for hearing loss (mild). Negative for ear pain, congestion, sore throat and neck pain.   Eyes: Negative for pain, discharge and redness.  Respiratory: Positive for cough (chronic) and shortness of breath. Negative for sputum production and wheezing.   Cardiovascular: Positive for leg swelling (trace in ankles). Negative for chest pain, palpitations, orthopnea, claudication and PND.  Gastrointestinal: Negative for heartburn, nausea, vomiting, abdominal pain, diarrhea and constipation.  Genitourinary: Positive for frequency (incontinent). Negative for dysuria, urgency and flank pain.  Musculoskeletal: Positive for back pain. Negative for joint pain and falls.  Skin: Negative for itching and rash.  Neurological: Negative for dizziness, tremors, sensory change, speech change, focal weakness, seizures, loss of consciousness and weakness.  Endo/Heme/Allergies: Negative for environmental allergies and polydipsia. Does not bruise/bleed  easily.  Psychiatric/Behavioral: Positive for depression and memory loss. Negative for hallucinations. The patient has insomnia. The patient is not nervous/anxious.      Medications: Reviewed at [facility]   Physical Exam: Physical Exam  Constitutional: She appears well-developed and well-nourished. No distress.  HENT:  Head: Normocephalic and atraumatic.  Eyes: Conjunctivae and EOM are normal. Pupils are equal, round, and reactive to light.  Neck: Normal range of motion. Neck supple. No JVD present.  Cardiovascular: Normal rate, regular rhythm and normal heart sounds.   No murmur heard. Pulmonary/Chest: Effort normal. No respiratory distress. She has no wheezes. She has rales (dry bibasilar).  Abdominal: Soft. Bowel sounds are normal. There is no tenderness.  Musculoskeletal: Normal range of motion. She exhibits no edema and no tenderness.  Lymphadenopathy:    She has no cervical adenopathy.  Neurological: She is alert. She has normal reflexes. She displays normal reflexes. No cranial nerve deficit. She exhibits normal muscle tone. Coordination normal.  Skin: Skin is warm and dry. No rash noted. No erythema.  Psychiatric: Her speech is normal. Her affect is blunt and inappropriate. She is agitated. Thought content is not paranoid and not delusional. Cognition and memory are impaired. She expresses impulsivity. She expresses no suicidal ideation.  Agitated when she is assisted with personal care     Filed Vitals:   11/16/12 1045  BP: 126/64  Pulse: 72  Temp: 97.6 F (36.4 C)  TempSrc: Tympanic  Resp: 20      Labs reviewed: Basic Metabolic Panel:  Recent Labs  47/82/95  NA 139  K 4.5  BUN 20  CREATININE 1.0  TSH 0.38*    Liver Function Tests:  Recent Labs  08/16/12  AST 10*  ALT 8  ALKPHOS 59    CBC:  Recent Labs  08/16/12  WBC 102.0  HGB 12.3  HCT 37  PLT 338    Significant Diagnostic Results:  08/14/12 CXR no evidence of acute or residual  infiltrate   Assessment/Plan COPD (chronic obstructive pulmonary disease) O2 dependent, takes Advair and Spiriva  Unspecified vitamin D deficiency Supplemented with Vit D  Other and unspecified hyperlipidemia Takes Atorvastatin   Major depressive disorder, single episode, unspecified Controlled on Cymbalta 60mg  since 03/27/12  Dementia with behavioral disturbance Takes Namenda and Aricept.   Unspecified essential hypertension Controlled on Metoprolol   Acute diastolic heart failure Furosemide 40mg  and Spironolactone 50mg , BNP 88.1 06/16/13, compensated clinically.   Osteoporosis, unspecified Treated with Fosamax and Ca  Insomnia, unspecified Stable, off Trazodone.   Edema Trace in ankles      Family/ staff Communication: none   Goals of care: maintaining her functional level and continue SNF for care.    Labs/tests ordered none

## 2012-11-16 NOTE — Assessment & Plan Note (Signed)
Treated with Fosamax and Ca   

## 2012-11-16 NOTE — Assessment & Plan Note (Signed)
Controlled on Metoprolol 

## 2012-11-16 NOTE — Assessment & Plan Note (Signed)
Supplemented with Vit D   

## 2012-11-16 NOTE — Assessment & Plan Note (Signed)
Controlled on Cymbalta 60mg since 03/27/12           

## 2012-11-16 NOTE — Assessment & Plan Note (Signed)
O2 dependent, takes Advair and Spiriva

## 2012-11-16 NOTE — Assessment & Plan Note (Signed)
Stable, off Trazodone.

## 2012-11-16 NOTE — Assessment & Plan Note (Signed)
Furosemide 40mg  and Spironolactone 50mg , BNP 88.1 06/16/13, compensated clinically.

## 2012-11-16 NOTE — Assessment & Plan Note (Signed)
Takes Atorvastatin.  

## 2012-11-16 NOTE — Assessment & Plan Note (Signed)
Takes Namenda and Aricept.    

## 2012-12-18 ENCOUNTER — Non-Acute Institutional Stay (SKILLED_NURSING_FACILITY): Payer: Medicare Other | Admitting: Nurse Practitioner

## 2012-12-18 DIAGNOSIS — F329 Major depressive disorder, single episode, unspecified: Secondary | ICD-10-CM

## 2012-12-18 DIAGNOSIS — R609 Edema, unspecified: Secondary | ICD-10-CM

## 2012-12-18 DIAGNOSIS — J449 Chronic obstructive pulmonary disease, unspecified: Secondary | ICD-10-CM

## 2012-12-18 DIAGNOSIS — I10 Essential (primary) hypertension: Secondary | ICD-10-CM

## 2012-12-18 DIAGNOSIS — M810 Age-related osteoporosis without current pathological fracture: Secondary | ICD-10-CM

## 2012-12-18 DIAGNOSIS — F0391 Unspecified dementia with behavioral disturbance: Secondary | ICD-10-CM

## 2012-12-18 NOTE — Assessment & Plan Note (Addendum)
Trace in ankles and no change since last visit. Takes Furosemide 40mg  and Spironolactone 50mg 

## 2012-12-18 NOTE — Assessment & Plan Note (Signed)
Treated with Fosamax and Ca

## 2012-12-18 NOTE — Assessment & Plan Note (Signed)
O2 dependent, takes Advair and Spiriva 

## 2012-12-18 NOTE — Progress Notes (Signed)
Patient ID: Patricia Roberson, female   DOB: [birth year], 79 y.o.   MRN: [id number]  Chief Complaint:  Chief Complaint  Patient presents with  . Medical Managment of Chronic Issues      HPI:   Problem List Items Addressed This Visit     ICD-9-CM   Major depressive disorder, single episode, unspecified     Controlled on Cymbalta 60mg  since 03/27/12      Unspecified essential hypertension     Controlled on Metoprolol       Chronic airway obstruction, not elsewhere classified     O2 dependent, takes Advair and Spiriva      Osteoporosis, unspecified     Treated with Fosamax and Ca      Edema - Primary     Trace in ankles and no change since last visit. Takes Furosemide 40mg  and Spironolactone 50mg       Dementia with behavioral disturbance     Takes Namenda and Aricept.          Review of Systems:  Review of Systems  Constitutional: Negative for fever, chills, weight loss, malaise/fatigue and diaphoresis.  HENT: Positive for hearing loss (mild). Negative for ear pain, congestion, sore throat and neck pain.   Eyes: Negative for pain, discharge and redness.  Respiratory: Positive for cough (chronic) and shortness of breath. Negative for sputum production and wheezing.   Cardiovascular: Positive for leg swelling (trace in ankles). Negative for chest pain, palpitations, orthopnea, claudication and PND.  Gastrointestinal: Negative for heartburn, nausea, vomiting, abdominal pain, diarrhea and constipation.  Genitourinary: Positive for frequency (incontinent). Negative for dysuria, urgency and flank pain.  Musculoskeletal: Positive for back pain. Negative for joint pain and falls.  Skin: Negative for itching and rash.  Neurological: Negative for dizziness, tremors, sensory change, speech change, focal weakness, seizures, loss of consciousness and weakness.  Endo/Heme/Allergies: Negative for environmental allergies and polydipsia. Does not bruise/bleed easily.   Psychiatric/Behavioral: Positive for depression and memory loss. Negative for hallucinations. The patient has insomnia. The patient is not nervous/anxious.      Medications: Reviewed at [facility]   Physical Exam: Physical Exam  Constitutional: She appears well-developed and well-nourished. No distress.  HENT:  Head: Normocephalic and atraumatic.  Eyes: Conjunctivae and EOM are normal. Pupils are equal, round, and reactive to light.  Neck: Normal range of motion. Neck supple. No JVD present.  Cardiovascular: Normal rate, regular rhythm and normal heart sounds.   No murmur heard. Pulmonary/Chest: Effort normal. No respiratory distress. She has no wheezes. She has rales (dry bibasilar).  Abdominal: Soft. Bowel sounds are normal. There is no tenderness.  Musculoskeletal: Normal range of motion. She exhibits no edema and no tenderness.  Lymphadenopathy:    She has no cervical adenopathy.  Neurological: She is alert. She has normal reflexes. She displays normal reflexes. No cranial nerve deficit. She exhibits normal muscle tone. Coordination normal.  Skin: Skin is warm and dry. No rash noted. No erythema.  Psychiatric: Her speech is normal. Her affect is blunt and inappropriate. She is agitated. Thought content is not paranoid and not delusional. Cognition and memory are impaired. She expresses impulsivity. She expresses no suicidal ideation.  Agitated when she is assisted with personal care     Filed Vitals:   12/18/12 1303  BP: 136/60  Pulse: 80  Temp: 98.2 F (36.8 C)  TempSrc: Tympanic  Resp: 18      Labs reviewed: Basic Metabolic Panel:  Recent Labs  40/98/11  NA 139  K  4.5  BUN 20  CREATININE 1.0  TSH 0.38*    Liver Function Tests:  Recent Labs  08/16/12  AST 10*  ALT 8  ALKPHOS 59    CBC:  Recent Labs  08/16/12  WBC 102.0  HGB 12.3  HCT 37  PLT 338    Anemia Panel: No results found for this basename: IRON, FOLATE, VITAMINB12,  in the last 8760  hours  Significant Diagnostic Results:     Assessment/Plan Problem List Items Addressed This Visit     ICD-9-CM   Major depressive disorder, single episode, unspecified     Controlled on Cymbalta 60mg  since 03/27/12      Unspecified essential hypertension     Controlled on Metoprolol       Chronic airway obstruction, not elsewhere classified     O2 dependent, takes Advair and Spiriva      Osteoporosis, unspecified     Treated with Fosamax and Ca      Edema - Primary     Trace in ankles and no change since last visit. Takes Furosemide 40mg  and Spironolactone 50mg       Dementia with behavioral disturbance     Takes Namenda and Aricept.               Family/ staff Communication: the patient may benefit from ROM and strengthening exercise and the patient expressed her desire to participate.    Goals of care: SNF  Labs/tests ordered none

## 2012-12-18 NOTE — Assessment & Plan Note (Signed)
Takes Namenda and Aricept.

## 2012-12-18 NOTE — Assessment & Plan Note (Signed)
Controlled on Metoprolol 

## 2012-12-18 NOTE — Assessment & Plan Note (Signed)
Controlled on Cymbalta 60mg  since 03/27/12

## 2013-01-08 ENCOUNTER — Non-Acute Institutional Stay (SKILLED_NURSING_FACILITY): Payer: Medicare Other | Admitting: Nurse Practitioner

## 2013-01-08 DIAGNOSIS — F0391 Unspecified dementia with behavioral disturbance: Secondary | ICD-10-CM

## 2013-01-08 DIAGNOSIS — F03918 Unspecified dementia, unspecified severity, with other behavioral disturbance: Secondary | ICD-10-CM

## 2013-01-08 DIAGNOSIS — M810 Age-related osteoporosis without current pathological fracture: Secondary | ICD-10-CM

## 2013-01-08 DIAGNOSIS — I10 Essential (primary) hypertension: Secondary | ICD-10-CM

## 2013-01-08 DIAGNOSIS — J4489 Other specified chronic obstructive pulmonary disease: Secondary | ICD-10-CM

## 2013-01-08 DIAGNOSIS — D509 Iron deficiency anemia, unspecified: Secondary | ICD-10-CM

## 2013-01-08 DIAGNOSIS — F329 Major depressive disorder, single episode, unspecified: Secondary | ICD-10-CM

## 2013-01-08 DIAGNOSIS — R609 Edema, unspecified: Secondary | ICD-10-CM

## 2013-01-08 DIAGNOSIS — J449 Chronic obstructive pulmonary disease, unspecified: Secondary | ICD-10-CM

## 2013-01-08 NOTE — Assessment & Plan Note (Signed)
Controlled on Cymbalta 60mg since 03/27/12           

## 2013-01-08 NOTE — Assessment & Plan Note (Addendum)
Takes Namenda and Aricept. Update TSH

## 2013-01-08 NOTE — Assessment & Plan Note (Signed)
Controlled on Metoprolol 25mg bid.    

## 2013-01-08 NOTE — Progress Notes (Signed)
Patient ID: Patricia Roberson, female   DOB: 1927-06-30, 79 y.o.   MRN: [id number]  Chief Complaint:  Chief Complaint  Patient presents with  . Medical Managment of Chronic Issues     HPI:    Problem List Items Addressed This Visit   ANEMIA-IRON DEFICIENCY     Update CBC    Major depressive disorder, single episode, unspecified     Controlled on Cymbalta 60mg  since 03/27/12        Unspecified essential hypertension     Controlled on Metoprolol 25mg  bid.       Chronic airway obstruction, not elsewhere classified     Off O2,  takes Advair and Spiriva        Osteoporosis, unspecified     Treated with Fosamax and Ca and Vit D        Edema - Primary     Trace in ankles L>R and no change since last visit. Takes Furosemide 40mg  and Spironolactone 50mg , check CMP. BNP 88.1 08/16/12        Dementia with behavioral disturbance     Takes Namenda and Aricept. Update TSH           Review of Systems:  Review of Systems  Constitutional: Negative for fever, chills, weight loss, malaise/fatigue and diaphoresis.  HENT: Positive for hearing loss (mild). Negative for ear pain, congestion, sore throat and neck pain.   Eyes: Negative for pain, discharge and redness.  Respiratory: Positive for cough (chronic) and shortness of breath. Negative for sputum production and wheezing.   Cardiovascular: Negative for chest pain, palpitations, orthopnea, claudication and PND. Leg swelling: trace in ankles, L>R.  Gastrointestinal: Negative for heartburn, nausea, vomiting, abdominal pain, diarrhea and constipation.  Genitourinary: Positive for frequency (incontinent). Negative for dysuria, urgency and flank pain.  Musculoskeletal: Positive for back pain. Negative for joint pain and falls.  Skin: Negative for itching and rash.  Neurological: Negative for dizziness, tremors, sensory change, speech change, focal weakness, seizures, loss of consciousness and weakness.  Endo/Heme/Allergies:  Negative for environmental allergies and polydipsia. Does not bruise/bleed easily.  Psychiatric/Behavioral: Positive for depression and memory loss. Negative for hallucinations. The patient has insomnia. The patient is not nervous/anxious.      Medications: Reviewed at [facility]   Physical Exam: Physical Exam  Constitutional: She appears well-developed and well-nourished. No distress.  HENT:  Head: Normocephalic and atraumatic.  Eyes: Conjunctivae and EOM are normal. Pupils are equal, round, and reactive to light.  Neck: Normal range of motion. Neck supple. No JVD present.  Cardiovascular: Normal rate, regular rhythm and normal heart sounds.   No murmur heard. Trace edema in ankles L>R  Pulmonary/Chest: Effort normal. No respiratory distress. She has no wheezes. She has rales (dry bibasilar).  Abdominal: Soft. Bowel sounds are normal. There is no tenderness.  Musculoskeletal: Normal range of motion. She exhibits no edema and no tenderness.  Lymphadenopathy:    She has no cervical adenopathy.  Neurological: She is alert. She has normal reflexes. She displays normal reflexes. No cranial nerve deficit. She exhibits normal muscle tone. Coordination normal.  Skin: Skin is warm and dry. No rash noted. No erythema.  Psychiatric: Her speech is normal. Her affect is blunt and inappropriate. She is agitated. Thought content is not paranoid and not delusional. Cognition and memory are impaired. She expresses impulsivity. She expresses no suicidal ideation.  Agitated when she is assisted with personal care     Filed Vitals:   01/08/13 1434  BP: 112/54  Pulse: 89  Temp: 97.9 F (36.6 C)  TempSrc: Tympanic  Resp: 18  SpO2: 96%      Labs reviewed: Basic Metabolic Panel:  Recent Labs  40/98/11  NA 139  K 4.5  BUN 20  CREATININE 1.0  TSH 0.38*    Liver Function Tests:  Recent Labs  08/16/12  AST 10*  ALT 8  ALKPHOS 59    CBC:  Recent Labs  08/16/12  WBC 102.0  HGB 12.3   HCT 37  PLT 338    Anemia Panel: No results found for this basename: IRON, FOLATE, VITAMINB12,  in the last 8760 hours  Significant Diagnostic Results:     Assessment/Plan Osteoporosis, unspecified Treated with Fosamax and Ca and Vit D      Major depressive disorder, single episode, unspecified Controlled on Cymbalta 60mg  since 03/27/12      Edema Trace in ankles L>R and no change since last visit. Takes Furosemide 40mg  and Spironolactone 50mg , check CMP. BNP 88.1 08/16/12      Dementia with behavioral disturbance Takes Namenda and Aricept. Update TSH      Chronic airway obstruction, not elsewhere classified Off O2,  takes Advair and Spiriva      Unspecified essential hypertension Controlled on Metoprolol 25mg  bid.     ANEMIA-IRON DEFICIENCY Update CBC      Family/ staff Communication: observe the patient.    Goals of care: SNF   Labs/tests ordered TSH, CBC, CMP

## 2013-01-08 NOTE — Assessment & Plan Note (Addendum)
Off O2,  takes Advair and Spiriva

## 2013-01-08 NOTE — Assessment & Plan Note (Signed)
Update CBC. 

## 2013-01-08 NOTE — Assessment & Plan Note (Addendum)
Trace in ankles L>R and no change since last visit. Takes Furosemide 40mg  and Spironolactone 50mg , check CMP. BNP 88.1 08/16/12

## 2013-01-08 NOTE — Assessment & Plan Note (Signed)
Treated with Fosamax and Ca and Vit D

## 2013-02-26 ENCOUNTER — Encounter: Payer: Self-pay | Admitting: Nurse Practitioner

## 2013-02-26 ENCOUNTER — Non-Acute Institutional Stay (SKILLED_NURSING_FACILITY): Payer: Medicare Other | Admitting: Nurse Practitioner

## 2013-02-26 DIAGNOSIS — J4489 Other specified chronic obstructive pulmonary disease: Secondary | ICD-10-CM

## 2013-02-26 DIAGNOSIS — J449 Chronic obstructive pulmonary disease, unspecified: Secondary | ICD-10-CM

## 2013-02-26 DIAGNOSIS — K5900 Constipation, unspecified: Secondary | ICD-10-CM

## 2013-02-26 DIAGNOSIS — F03918 Unspecified dementia, unspecified severity, with other behavioral disturbance: Secondary | ICD-10-CM

## 2013-02-26 DIAGNOSIS — I10 Essential (primary) hypertension: Secondary | ICD-10-CM

## 2013-02-26 DIAGNOSIS — F329 Major depressive disorder, single episode, unspecified: Secondary | ICD-10-CM

## 2013-02-26 DIAGNOSIS — R609 Edema, unspecified: Secondary | ICD-10-CM

## 2013-02-26 DIAGNOSIS — F0391 Unspecified dementia with behavioral disturbance: Secondary | ICD-10-CM

## 2013-02-26 LAB — CBC AND DIFFERENTIAL
HCT: 41 % (ref 36–46)
Hemoglobin: 13.8 g/dL (ref 12.0–16.0)
WBC: 9.7 10^3/mL

## 2013-02-26 LAB — BASIC METABOLIC PANEL
Glucose: 90 mg/dL
Potassium: 4.3 mmol/L (ref 3.4–5.3)
Sodium: 139 mmol/L (ref 137–147)

## 2013-02-26 LAB — HEPATIC FUNCTION PANEL
ALT: 8 U/L (ref 7–35)
AST: 13 U/L (ref 13–35)
Bilirubin, Total: 1 mg/dL

## 2013-02-26 NOTE — Progress Notes (Signed)
Patient ID: Patricia Roberson, female   DOB: May 11, 1927, 79 y.o.   MRN: [id number] Code Status: Full Code, Most.   Allergies  Allergen Reactions  . Codeine   . Morphine     Chief Complaint  Patient presents with  . Medical Managment of Chronic Issues    HPI: Patient is a 79 y.o. female seen in the clinic at [facility] today for evaluation of her chronic medical conditions.  Problem List Items Addressed This Visit   COPD (chronic obstructive pulmonary disease)     O2 dependent, takes Advair and Spiriva, stable         Dementia with behavioral disturbance     Takes Namenda and Aricept. Stable at SNF--stay in her room almost all the time.           Edema     Trace in ankles L>R and no change since last visit. Takes Furosemide 40mg  and Spironolactone 50mg , Bun/creat 21/1.03 01/10/13,  BNP 88.1 08/16/12          Major depressive disorder, single episode, unspecified - Primary     Controlled on Cymbalta 60mg  since 03/27/12          Unspecified constipation     Stable on MiraLax daily.     Unspecified essential hypertension     Controlled on Metoprolol 25mg  bid.            Review of Systems:  Review of Systems  Constitutional: Negative for fever, chills, weight loss, malaise/fatigue and diaphoresis.  HENT: Positive for hearing loss (mild). Negative for ear pain, congestion, sore throat and neck pain.   Eyes: Negative for pain, discharge and redness.  Respiratory: Positive for cough (chronic) and shortness of breath. Negative for sputum production and wheezing.   Cardiovascular: Negative for chest pain, palpitations, orthopnea, claudication and PND. Leg swelling: trace in ankles, L>R.  Gastrointestinal: Negative for heartburn, nausea, vomiting, abdominal pain, diarrhea and constipation.  Genitourinary: Positive for frequency (incontinent). Negative for dysuria, urgency and flank pain.  Musculoskeletal: Positive for back pain. Negative for joint pain and  falls.  Skin: Negative for itching and rash.  Neurological: Negative for dizziness, tremors, sensory change, speech change, focal weakness, seizures, loss of consciousness and weakness.  Endo/Heme/Allergies: Negative for environmental allergies and polydipsia. Does not bruise/bleed easily.  Psychiatric/Behavioral: Positive for depression and memory loss. Negative for hallucinations. The patient has insomnia. The patient is not nervous/anxious.        Medications: Reviewed at [facility]   Physical Exam: Physical Exam  Constitutional: She appears well-developed and well-nourished. No distress.  HENT:  Head: Normocephalic and atraumatic.  Eyes: Conjunctivae and EOM are normal. Pupils are equal, round, and reactive to light.  Neck: Normal range of motion. Neck supple. No JVD present.  Cardiovascular: Normal rate, regular rhythm and normal heart sounds.   No murmur heard. Trace edema in ankles L>R  Pulmonary/Chest: Effort normal. No respiratory distress. She has no wheezes. She has rales (dry bibasilar).  Abdominal: Soft. Bowel sounds are normal. There is no tenderness.  Musculoskeletal: Normal range of motion. She exhibits no edema and no tenderness.  Lymphadenopathy:    She has no cervical adenopathy.  Neurological: She is alert. She has normal reflexes. She displays normal reflexes. No cranial nerve deficit. She exhibits normal muscle tone. Coordination normal.  Skin: Skin is warm and dry. No rash noted. No erythema.  Psychiatric: Her speech is normal. Her affect is blunt and inappropriate. She is agitated. Thought content is not  paranoid and not delusional. Cognition and memory are impaired. She expresses impulsivity. She expresses no suicidal ideation.  Agitated when she is assisted with personal care    Filed Vitals:   02/26/13 1329  BP: 126/60  Pulse: 78  Temp: 97.4 F (36.3 C)  TempSrc: Tympanic  Resp: 20      Labs reviewed: Basic Metabolic Panel:  Recent Labs  16/10/96  02/26/13  NA 139 139  K 4.5 4.3  BUN 20 21  CREATININE 1.0 1.0  TSH 0.38* 0.57   Liver Function Tests:  Recent Labs  08/16/12 02/26/13  AST 10* 13  ALT 8 8  ALKPHOS 59 61   CBC:  Recent Labs  08/16/12 02/26/13  WBC 102.0 9.7  HGB 12.3 13.8  HCT 37 41  PLT 338 305   Lipid Panel:  Recent Labs  08/16/12  HDL 47  LDLCALC 53  TRIG 39*        Assessment/Plan Major depressive disorder, single episode, unspecified Controlled on Cymbalta 60mg  since 03/27/12        Edema Trace in ankles L>R and no change since last visit. Takes Furosemide 40mg  and Spironolactone 50mg , Bun/creat 21/1.03 01/10/13,  BNP 88.1 08/16/12        Dementia with behavioral disturbance Takes Namenda and Aricept. Stable at SNF--stay in her room almost all the time.         COPD (chronic obstructive pulmonary disease) O2 dependent, takes Advair and Spiriva, stable       Unspecified essential hypertension Controlled on Metoprolol 25mg  bid.       Unspecified constipation Stable on MiraLax daily.     Family/ Staff Communication: observe the patient.   Goals of Care: SNF  Labs/tests ordered: none.

## 2013-02-26 NOTE — Assessment & Plan Note (Addendum)
Takes Namenda and Aricept. Stable at SNF--stay in her room almost all the time.

## 2013-02-26 NOTE — Assessment & Plan Note (Signed)
Trace in ankles L>R and no change since last visit. Takes Furosemide 40mg  and Spironolactone 50mg , Bun/creat 21/1.03 01/10/13,  BNP 88.1 08/16/12

## 2013-02-26 NOTE — Assessment & Plan Note (Signed)
O2 dependent, takes Advair and Spiriva, stable          

## 2013-02-26 NOTE — Assessment & Plan Note (Signed)
Stable on MiraLax daily.  

## 2013-02-26 NOTE — Assessment & Plan Note (Signed)
Controlled on Cymbalta 60mg since 03/27/12           

## 2013-02-26 NOTE — Assessment & Plan Note (Signed)
Controlled on Metoprolol 25mg bid.    

## 2013-03-21 ENCOUNTER — Encounter: Payer: Self-pay | Admitting: Internal Medicine

## 2013-03-22 ENCOUNTER — Encounter: Payer: Self-pay | Admitting: Nurse Practitioner

## 2013-03-22 ENCOUNTER — Non-Acute Institutional Stay (SKILLED_NURSING_FACILITY): Payer: Medicare Other | Admitting: Nurse Practitioner

## 2013-03-22 DIAGNOSIS — R609 Edema, unspecified: Secondary | ICD-10-CM

## 2013-03-22 DIAGNOSIS — K5900 Constipation, unspecified: Secondary | ICD-10-CM

## 2013-03-22 DIAGNOSIS — D509 Iron deficiency anemia, unspecified: Secondary | ICD-10-CM

## 2013-03-22 DIAGNOSIS — F329 Major depressive disorder, single episode, unspecified: Secondary | ICD-10-CM

## 2013-03-22 DIAGNOSIS — I10 Essential (primary) hypertension: Secondary | ICD-10-CM

## 2013-03-22 DIAGNOSIS — J449 Chronic obstructive pulmonary disease, unspecified: Secondary | ICD-10-CM

## 2013-03-22 DIAGNOSIS — I5031 Acute diastolic (congestive) heart failure: Secondary | ICD-10-CM

## 2013-03-22 DIAGNOSIS — F0391 Unspecified dementia with behavioral disturbance: Secondary | ICD-10-CM

## 2013-03-22 NOTE — Assessment & Plan Note (Signed)
Controlled on Metoprolol 6.25mg  bid.

## 2013-03-22 NOTE — Assessment & Plan Note (Signed)
O2 dependent, takes Advair and Spiriva, stable          

## 2013-03-22 NOTE — Assessment & Plan Note (Signed)
Trace in ankle--update CMP, continue Spironolactone and Furosemide

## 2013-03-22 NOTE — Assessment & Plan Note (Signed)
Stable on MiraLax daily.  

## 2013-03-22 NOTE — Assessment & Plan Note (Signed)
Takes Namenda 28mg  daily  and Aricept 5mg  daily. Stable at SNF--stay in her room almost all the time.

## 2013-03-22 NOTE — Assessment & Plan Note (Signed)
Trace in ankles and no change since last visit. Takes Furosemide 40mg  and Spironolactone 50mg 

## 2013-03-22 NOTE — Assessment & Plan Note (Signed)
Update CBC. 

## 2013-03-22 NOTE — Progress Notes (Signed)
Patient ID: Patricia Roberson, female   DOB: 1926/11/19, 79 y.o.   MRN: [id number] Code Status: DNR  Allergies  Allergen Reactions  . Codeine   . Morphine     Chief Complaint  Patient presents with  . Medical Managment of Chronic Issues    HPI: Patient is a 79 y.o. female seen in the SNF at [facility] today for evaluation of anemia, HTN, edema,  and other chronic medical conditions.  Problem List Items Addressed This Visit   Acute diastolic heart failure     Trace in ankles and no change since last visit. Takes Furosemide 40mg  and Spironolactone 50mg         ANEMIA-IRON DEFICIENCY     Update CBC    COPD (chronic obstructive pulmonary disease)     O2 dependent, takes Advair and Spiriva, stable           Dementia with behavioral disturbance     Takes Namenda 28mg  daily  and Aricept 5mg  daily. Stable at SNF--stay in her room almost all the time.             Edema     Trace in ankle--update CMP, continue Spironolactone and Furosemide    Major depressive disorder, single episode, unspecified - Primary     Controlled on Cymbalta 60mg  since 03/27/12            Unspecified constipation     Stable on MiraLax daily.       Unspecified essential hypertension     Controlled on Metoprolol 6.25mg  bid.              Review of Systems:  Review of Systems  Constitutional: Negative for fever, chills, weight loss, malaise/fatigue and diaphoresis.  HENT: Positive for hearing loss (mild). Negative for ear pain, congestion, sore throat and neck pain.   Eyes: Negative for pain, discharge and redness.  Respiratory: Positive for cough (chronic) and shortness of breath. Negative for sputum production and wheezing.   Cardiovascular: Negative for chest pain, palpitations, orthopnea, claudication and PND. Leg swelling: trace in ankles, L>R.  Gastrointestinal: Negative for heartburn, nausea, vomiting, abdominal pain, diarrhea and constipation.  Genitourinary:  Positive for frequency (incontinent). Negative for dysuria, urgency and flank pain.  Musculoskeletal: Positive for back pain. Negative for joint pain and falls.  Skin: Negative for itching and rash.  Neurological: Negative for dizziness, tremors, sensory change, speech change, focal weakness, seizures, loss of consciousness and weakness.  Endo/Heme/Allergies: Negative for environmental allergies and polydipsia. Does not bruise/bleed easily.  Psychiatric/Behavioral: Positive for depression and memory loss. Negative for hallucinations. The patient has insomnia. The patient is not nervous/anxious.      Past Medical History  Diagnosis Date  . Depression   . Hypertension   . CHF (congestive heart failure)     Medications: Reviewed at [facility]   Physical Exam: Physical Exam  Constitutional: She appears well-developed and well-nourished. No distress.  HENT:  Head: Normocephalic and atraumatic.  Eyes: Conjunctivae and EOM are normal. Pupils are equal, round, and reactive to light.  Neck: Normal range of motion. Neck supple. No JVD present.  Cardiovascular: Normal rate, regular rhythm and normal heart sounds.   No murmur heard. Trace edema in ankles L>R  Pulmonary/Chest: Effort normal. No respiratory distress. She has no wheezes. She has rales (dry bibasilar).  Abdominal: Soft. Bowel sounds are normal. There is no tenderness.  Musculoskeletal: Normal range of motion. She exhibits no edema and no tenderness.  Lymphadenopathy:    She  has no cervical adenopathy.  Neurological: She is alert. She has normal reflexes. She displays normal reflexes. No cranial nerve deficit. She exhibits normal muscle tone. Coordination normal.  Skin: Skin is warm and dry. No rash noted. No erythema.  Psychiatric: Her speech is normal. Her affect is blunt and inappropriate. She is agitated. Thought content is not paranoid and not delusional. Cognition and memory are impaired. She expresses impulsivity. She expresses  no suicidal ideation.  Agitated when she is assisted with personal care    Filed Vitals:   03/22/13 1546  BP: 126/60  Pulse: 78  Temp: 97.2 F (36.2 C)  TempSrc: Tympanic  Resp: 20      Labs reviewed: Basic Metabolic Panel:  Recent Labs  45/40/98 02/26/13  NA 139 139  K 4.5 4.3  BUN 20 21  CREATININE 1.0 1.0  TSH 0.38* 0.57   Liver Function Tests:  Recent Labs  08/16/12 02/26/13  AST 10* 13  ALT 8 8  ALKPHOS 59 61   CBC:  Recent Labs  08/16/12 02/26/13  WBC 102.0 9.7  HGB 12.3 13.8  HCT 37 41  PLT 338 305   Lipid Panel:  Recent Labs  08/16/12  HDL 47  LDLCALC 53  TRIG 39*    Past Procedures:  08/14/12 CXR no evidence of acute or residual infiltrate is seen.    Assessment/Plan Major depressive disorder, single episode, unspecified Controlled on Cymbalta 60mg  since 03/27/12          Acute diastolic heart failure Trace in ankles and no change since last visit. Takes Furosemide 40mg  and Spironolactone 50mg       Dementia with behavioral disturbance Takes Namenda 28mg  daily  and Aricept 5mg  daily. Stable at SNF--stay in her room almost all the time.           COPD (chronic obstructive pulmonary disease) O2 dependent, takes Advair and Spiriva, stable         Unspecified essential hypertension Controlled on Metoprolol 6.25mg  bid.         Unspecified constipation Stable on MiraLax daily.     Edema Trace in ankle--update CMP, continue Spironolactone and Furosemide  ANEMIA-IRON DEFICIENCY Update CBC    Family/ Staff Communication: observe the patient.   Goals of Care: SNF  Labs/tests ordered:  CBC and CMP

## 2013-03-22 NOTE — Assessment & Plan Note (Signed)
Controlled on Cymbalta 60mg since 03/27/12           

## 2013-03-25 LAB — BASIC METABOLIC PANEL
BUN: 22 mg/dL — AB (ref 4–21)
Glucose: 65 mg/dL
Potassium: 4.1 mmol/L (ref 3.4–5.3)
Sodium: 141 mmol/L (ref 137–147)

## 2013-03-25 LAB — HEPATIC FUNCTION PANEL: Alkaline Phosphatase: 67 U/L (ref 25–125)

## 2013-04-03 NOTE — Progress Notes (Signed)
  Subjective:    Patient ID: Patricia Roberson, female    DOB: June 03, 1927, 79 y.o.   MRN: [id number]  HPI    Review of Systems     Objective:   Physical Exam   LAB REVIEW: 01/10/13 CBC normal    CMP: normal    TSH 0.568     Assessment & Plan:

## 2013-04-19 ENCOUNTER — Non-Acute Institutional Stay (SKILLED_NURSING_FACILITY): Payer: Medicare Other | Admitting: Nurse Practitioner

## 2013-04-19 ENCOUNTER — Encounter: Payer: Self-pay | Admitting: Nurse Practitioner

## 2013-04-19 DIAGNOSIS — F329 Major depressive disorder, single episode, unspecified: Secondary | ICD-10-CM

## 2013-04-19 DIAGNOSIS — F0391 Unspecified dementia with behavioral disturbance: Secondary | ICD-10-CM

## 2013-04-19 DIAGNOSIS — R609 Edema, unspecified: Secondary | ICD-10-CM

## 2013-04-19 DIAGNOSIS — J449 Chronic obstructive pulmonary disease, unspecified: Secondary | ICD-10-CM

## 2013-04-19 DIAGNOSIS — I10 Essential (primary) hypertension: Secondary | ICD-10-CM

## 2013-04-19 DIAGNOSIS — D509 Iron deficiency anemia, unspecified: Secondary | ICD-10-CM

## 2013-04-19 DIAGNOSIS — K5900 Constipation, unspecified: Secondary | ICD-10-CM

## 2013-04-19 DIAGNOSIS — F03918 Unspecified dementia, unspecified severity, with other behavioral disturbance: Secondary | ICD-10-CM

## 2013-04-19 DIAGNOSIS — I5031 Acute diastolic (congestive) heart failure: Secondary | ICD-10-CM

## 2013-04-19 NOTE — Assessment & Plan Note (Signed)
Controlled on Cymbalta 60mg  since 03/27/12

## 2013-04-19 NOTE — Assessment & Plan Note (Signed)
Trace in ankles and no change since last visit. Takes Furosemide 40mg  and Spironolactone 50mg . Clinically compensated.

## 2013-04-19 NOTE — Assessment & Plan Note (Signed)
Controlled on Metoprolol 6.25mg bid and Furosemide 40mg/Spironolactone 50mg daily.            

## 2013-04-19 NOTE — Assessment & Plan Note (Signed)
Stable on MiraLax daily.  

## 2013-04-19 NOTE — Progress Notes (Signed)
Patient ID: Patricia Roberson, female   DOB: 1927/03/22, 79 y.o.   MRN: [id number] Code Status: DNR  Allergies  Allergen Reactions  . Codeine   . Morphine     Chief Complaint  Patient presents with  . Medical Managment of Chronic Issues    HPI: Patient is a 79 y.o. female seen in the SNF at [facility] today for evaluation of her chronic medical conditions.  Problem List Items Addressed This Visit   Acute diastolic heart failure     Trace in ankles and no change since last visit. Takes Furosemide 40mg  and Spironolactone 50mg . Clinically compensated.           ANEMIA-IRON DEFICIENCY     Resolved, Hgb 13.7 03/25/13     COPD (chronic obstructive pulmonary disease)     O2 dependent, takes Advair and Spiriva, stable             Dementia with behavioral disturbance     Takes Namenda and Aricept. Stable at SNF--stay in her room almost all the time.               Edema     Trace in ankle--continue Spironolactone and Furosemide      Major depressive disorder, single episode, unspecified     Controlled on Cymbalta 60mg  since 03/27/12              Unspecified constipation     Stable on MiraLax daily.         Unspecified essential hypertension - Primary     Controlled on Metoprolol 6.25mg  bid and Furosemide 40mg /Spironolactone 50mg  daily.                Review of Systems:  Review of Systems  Constitutional: Negative for fever, chills, weight loss, malaise/fatigue and diaphoresis.  HENT: Positive for hearing loss (mild). Negative for ear pain, congestion, sore throat and neck pain.   Eyes: Negative for pain, discharge and redness.  Respiratory: Positive for cough (chronic) and shortness of breath. Negative for sputum production and wheezing.   Cardiovascular: Negative for chest pain, palpitations, orthopnea, claudication and PND. Leg swelling: trace in ankles, L>R.  Gastrointestinal: Negative for heartburn, nausea, vomiting, abdominal  pain, diarrhea and constipation.  Genitourinary: Positive for frequency (incontinent). Negative for dysuria, urgency and flank pain.  Musculoskeletal: Positive for back pain. Negative for joint pain and falls.  Skin: Negative for itching and rash.  Neurological: Negative for dizziness, tremors, sensory change, speech change, focal weakness, seizures, loss of consciousness and weakness.  Endo/Heme/Allergies: Negative for environmental allergies and polydipsia. Does not bruise/bleed easily.  Psychiatric/Behavioral: Positive for depression and memory loss. Negative for hallucinations. The patient has insomnia. The patient is not nervous/anxious.      Past Medical History  Diagnosis Date  . Depression   . Hypertension   . CHF (congestive heart failure)   . Alzheimer's disease   . Edema   . Dementia in conditions classified elsewhere with behavioral disturbance(294.11)    Medications: Reviewed at [facility]  Physical Exam: Physical Exam  Constitutional: She appears well-developed and well-nourished. No distress.  HENT:  Head: Normocephalic and atraumatic.  Eyes: Conjunctivae and EOM are normal. Pupils are equal, round, and reactive to light.  Neck: Normal range of motion. Neck supple. No JVD present.  Cardiovascular: Normal rate, regular rhythm and normal heart sounds.   No murmur heard. Trace edema in ankles L>R  Pulmonary/Chest: Effort normal. No respiratory distress. She has no wheezes. She has rales (dry  bibasilar).  Abdominal: Soft. Bowel sounds are normal. There is no tenderness.  Musculoskeletal: Normal range of motion. She exhibits no edema and no tenderness.  Lymphadenopathy:    She has no cervical adenopathy.  Neurological: She is alert. She has normal reflexes. No cranial nerve deficit. She exhibits normal muscle tone. Coordination normal.  Skin: Skin is warm and dry. No rash noted. No erythema.  Psychiatric: Her speech is normal. Her affect is blunt and inappropriate. She is  agitated. Thought content is not paranoid and not delusional. Cognition and memory are impaired. She expresses impulsivity. She expresses no suicidal ideation.  Agitated when she is assisted with personal care    Filed Vitals:   04/19/13 1442  BP: 113/72  Pulse: 70  Temp: 98.3 F (36.8 C)  TempSrc: Tympanic  Resp: 18   Labs reviewed: Basic Metabolic Panel:  Recent Labs  96/04/54 02/26/13 03/25/13  NA 139 139 141  K 4.5 4.3 4.1  BUN 20 21 22*  CREATININE 1.0 1.0 1.1  TSH 0.38* 0.57  --    Liver Function Tests:  Recent Labs  08/16/12 02/26/13 03/25/13  AST 10* 13 14  ALT 8 8 8   ALKPHOS 59 61 67   CBC:  Recent Labs  08/16/12 02/26/13 03/25/13  WBC 102.0 9.7 10.6  HGB 12.3 13.8 13.7  HCT 37 41 41  PLT 338 305 333   Lipid Panel:  Recent Labs  08/16/12  HDL 47  LDLCALC 53  TRIG 39*   Past Procedures:  08/14/12 CXR no evidence of acute or residual infiltrate is seen.   Assessment/Plan Unspecified essential hypertension Controlled on Metoprolol 6.25mg  bid and Furosemide 40mg /Spironolactone 50mg  daily.           Unspecified constipation Stable on MiraLax daily.       Major depressive disorder, single episode, unspecified Controlled on Cymbalta 60mg  since 03/27/12            Edema Trace in ankle--continue Spironolactone and Furosemide    Dementia with behavioral disturbance Takes Namenda and Aricept. Stable at SNF--stay in her room almost all the time.             COPD (chronic obstructive pulmonary disease) O2 dependent, takes Advair and Spiriva, stable           ANEMIA-IRON DEFICIENCY Resolved, Hgb 13.7 03/25/13   Acute diastolic heart failure Trace in ankles and no change since last visit. Takes Furosemide 40mg  and Spironolactone 50mg . Clinically compensated.           Family/ Staff Communication: observe the patient.   Goals of Care: SNF  Labs/tests ordered: none.

## 2013-04-19 NOTE — Assessment & Plan Note (Signed)
O2 dependent, takes Advair and Spiriva, stable

## 2013-04-19 NOTE — Assessment & Plan Note (Signed)
Resolved, Hgb 13.7 03/25/13    

## 2013-04-19 NOTE — Assessment & Plan Note (Signed)
Trace in ankle--continue Spironolactone and Furosemide

## 2013-04-19 NOTE — Assessment & Plan Note (Signed)
Takes Namenda and Aricept. Stable at SNF--stay in her room almost all the time.        

## 2013-04-30 ENCOUNTER — Non-Acute Institutional Stay (SKILLED_NURSING_FACILITY): Payer: Medicare Other | Admitting: Nurse Practitioner

## 2013-04-30 DIAGNOSIS — G4700 Insomnia, unspecified: Secondary | ICD-10-CM

## 2013-04-30 DIAGNOSIS — J449 Chronic obstructive pulmonary disease, unspecified: Secondary | ICD-10-CM

## 2013-04-30 DIAGNOSIS — F329 Major depressive disorder, single episode, unspecified: Secondary | ICD-10-CM

## 2013-04-30 DIAGNOSIS — R609 Edema, unspecified: Secondary | ICD-10-CM

## 2013-04-30 DIAGNOSIS — F0391 Unspecified dementia with behavioral disturbance: Secondary | ICD-10-CM

## 2013-04-30 DIAGNOSIS — I10 Essential (primary) hypertension: Secondary | ICD-10-CM

## 2013-04-30 DIAGNOSIS — J189 Pneumonia, unspecified organism: Secondary | ICD-10-CM

## 2013-04-30 DIAGNOSIS — D509 Iron deficiency anemia, unspecified: Secondary | ICD-10-CM

## 2013-04-30 DIAGNOSIS — K5900 Constipation, unspecified: Secondary | ICD-10-CM

## 2013-04-30 DIAGNOSIS — I739 Peripheral vascular disease, unspecified: Secondary | ICD-10-CM

## 2013-04-30 NOTE — Progress Notes (Signed)
Patient ID: Patricia Roberson, female   DOB: 05/07/1927, 79 y.o.   MRN: [id number] Code Status: DNR  Allergies  Allergen Reactions  . Codeine   . Morphine     Chief Complaint  Patient presents with  . Medical Managment of Chronic Issues    PNA, purple/red/bluish appearance feet.     HPI: Patient is a 79 y.o. female seen in the SNF at [facility] today for evaluation of cough, congestion, wheezes, lethargy, her chronic medical conditions.  Problem List Items Addressed This Visit   ANEMIA-IRON DEFICIENCY     Resolved, Hgb 13.7 03/25/13       COPD (chronic obstructive pulmonary disease)     O2 dependent, takes Advair and Spiriva, stable               Dementia with behavioral disturbance     Takes Namenda and Aricept. Stable at SNF--stay in her room almost all the time.                 Edema     Trace in ankle--continue Spironolactone and Furosemide. Update CMP       Insomnia, unspecified     Stable, off Trazodone.       Major depressive disorder, single episode, unspecified     Controlled on Cymbalta 60mg  since 03/27/12                PNA (pneumonia)     Cough, congestion, CXR 04/30/13 patchy bibasilar atelectasis or pneumonitis. 14 day course of Augmentin, Medrol dose pk, Mucinex bid x 7 days started 04/29/13. Observe the patient.     PVD (peripheral vascular disease) - Primary     The etiology of dark red/bluish appearance of feet--05/01/13 Arterial US showed significant arterial stenosis     Unspecified constipation     Stable on MiraLax daily.           Unspecified essential hypertension     Controlled on Metoprolol 6.25mg  bid and Furosemide 40mg /Spironolactone 50mg  daily.                  Review of Systems:  Review of Systems  Constitutional: Positive for malaise/fatigue. Negative for fever, chills, weight loss and diaphoresis.  HENT: Positive for hearing loss (mild). Negative for ear pain, congestion, sore  throat and neck pain.   Eyes: Negative for pain, discharge and redness.  Respiratory: Positive for cough (chronic), shortness of breath and wheezing. Negative for sputum production.   Cardiovascular: Negative for chest pain, palpitations, orthopnea, claudication and PND. Leg swelling: trace in ankles, L>R.  Gastrointestinal: Negative for heartburn, nausea, vomiting, abdominal pain, diarrhea and constipation.  Genitourinary: Positive for frequency (incontinent). Negative for dysuria, urgency and flank pain.  Musculoskeletal: Positive for back pain. Negative for joint pain and falls.  Skin: Negative for itching and rash.  Neurological: Negative for dizziness, tremors, sensory change, speech change, focal weakness, seizures, loss of consciousness and weakness.  Endo/Heme/Allergies: Negative for environmental allergies and polydipsia. Does not bruise/bleed easily.  Psychiatric/Behavioral: Positive for depression and memory loss. Negative for hallucinations. The patient has insomnia. The patient is not nervous/anxious.      Past Medical History  Diagnosis Date  . Depression   . Hypertension   . CHF (congestive heart failure)   . Alzheimer's disease   . Edema   . Dementia in conditions classified elsewhere with behavioral disturbance(294.11)    Medications: Reviewed at [facility]  Physical Exam: Physical Exam  Constitutional: She appears well-developed and  well-nourished. No distress.  HENT:  Head: Normocephalic and atraumatic.  Eyes: Conjunctivae and EOM are normal. Pupils are equal, round, and reactive to light.  Neck: Normal range of motion. Neck supple. No JVD present.  Cardiovascular: Normal rate, regular rhythm and normal heart sounds.   No murmur heard. Trace edema in ankles L>R  Pulmonary/Chest: Effort normal. No respiratory distress. She has wheezes. She has rales (dry bibasilar).  Abdominal: Soft. Bowel sounds are normal. There is no tenderness.  Musculoskeletal: Normal range of  motion. She exhibits no edema and no tenderness.  Lymphadenopathy:    She has no cervical adenopathy.  Neurological: She is alert. She has normal reflexes. No cranial nerve deficit. She exhibits normal muscle tone. Coordination normal.  Skin: Skin is warm and dry. No rash noted. No erythema.  Purple/bluish/dark red appearance of feet  Psychiatric: Her speech is normal. Her affect is blunt and inappropriate. She is agitated. Thought content is not paranoid and not delusional. Cognition and memory are impaired. She expresses impulsivity. She expresses no suicidal ideation.  Agitated when she is assisted with personal care    Filed Vitals:   05/03/13 1602  BP: 122/60  Pulse: 68  Temp: 97.5 F (36.4 C)  TempSrc: Tympanic  Resp: 20   Labs reviewed: Basic Metabolic Panel:  Recent Labs  78/29/56 02/26/13 03/25/13  NA 139 139 141  K 4.5 4.3 4.1  BUN 20 21 22*  CREATININE 1.0 1.0 1.1  TSH 0.38* 0.57  --    Liver Function Tests:  Recent Labs  08/16/12 02/26/13 03/25/13  AST 10* 13 14  ALT 8 8 8   ALKPHOS 59 61 67   CBC:  Recent Labs  08/16/12 02/26/13 03/25/13  WBC 102.0 9.7 10.6  HGB 12.3 13.8 13.7  HCT 37 41 41  PLT 338 305 333   Lipid Panel:  Recent Labs  08/16/12  HDL 47  LDLCALC 53  TRIG 39*   Past Procedures:  08/14/12 CXR no evidence of acute or residual infiltrate is seen.   04/30/13 CXR patchy bibasilar atelectasis or pneumonitis. Small left pleural effusion. Borderline cardiomegaly unchanged with new mild pulmonary vascular congestion.   05/01/13 BLE arterial US: significant arterial stenosis   Assessment/Plan PVD (peripheral vascular disease) The etiology of dark red/bluish appearance of feet--05/01/13 Arterial US showed significant arterial stenosis   PNA (pneumonia) Cough, congestion, CXR 04/30/13 patchy bibasilar atelectasis or pneumonitis. 14 day course of Augmentin, Medrol dose pk, Mucinex bid x 7 days started 04/29/13. Observe the patient.    ANEMIA-IRON DEFICIENCY Resolved, Hgb 13.7 03/25/13     Dementia with behavioral disturbance Takes Namenda and Aricept. Stable at SNF--stay in her room almost all the time.               COPD (chronic obstructive pulmonary disease) O2 dependent, takes Advair and Spiriva, stable             Edema Trace in ankle--continue Spironolactone and Furosemide. Update CMP     Insomnia, unspecified Stable, off Trazodone.     Major depressive disorder, single episode, unspecified Controlled on Cymbalta 60mg  since 03/27/12              Unspecified constipation Stable on MiraLax daily.         Unspecified essential hypertension Controlled on Metoprolol 6.25mg  bid and Furosemide 40mg /Spironolactone 50mg  daily.               Family/ Staff Communication: observe the patient.   Goals of Care: SNF  Labs/tests ordered: CXR done 04/30/13, BLE arterial US 05/01/13, CMP next lab.

## 2013-05-03 ENCOUNTER — Encounter: Payer: Self-pay | Admitting: Nurse Practitioner

## 2013-05-03 DIAGNOSIS — J189 Pneumonia, unspecified organism: Secondary | ICD-10-CM | POA: Insufficient documentation

## 2013-05-03 DIAGNOSIS — I739 Peripheral vascular disease, unspecified: Secondary | ICD-10-CM | POA: Insufficient documentation

## 2013-05-03 NOTE — Assessment & Plan Note (Signed)
Controlled on Metoprolol 6.25mg  bid and Furosemide 40mg /Spironolactone 50mg  daily.

## 2013-05-03 NOTE — Assessment & Plan Note (Signed)
Cough, congestion, CXR 04/30/13 patchy bibasilar atelectasis or pneumonitis. 14 day course of Augmentin, Medrol dose pk, Mucinex bid x 7 days started 04/29/13. Observe the patient.

## 2013-05-03 NOTE — Assessment & Plan Note (Signed)
Takes Namenda and Aricept. Stable at SNF--stay in her room almost all the time.        

## 2013-05-03 NOTE — Assessment & Plan Note (Signed)
Resolved, Hgb 13.7 03/25/13

## 2013-05-03 NOTE — Assessment & Plan Note (Signed)
O2 dependent, takes Advair and Spiriva, stable          

## 2013-05-03 NOTE — Assessment & Plan Note (Signed)
Trace in ankle--continue Spironolactone and Furosemide. Update CMP

## 2013-05-03 NOTE — Assessment & Plan Note (Signed)
Controlled on Cymbalta 60mg since 03/27/12           

## 2013-05-03 NOTE — Assessment & Plan Note (Addendum)
The etiology of dark red/bluish appearance of feet--05/01/13 Arterial US showed significant arterial stenosis

## 2013-05-03 NOTE — Assessment & Plan Note (Signed)
Stable on MiraLax daily.  

## 2013-05-03 NOTE — Assessment & Plan Note (Signed)
Stable, off Trazodone.

## 2013-05-20 ENCOUNTER — Encounter (HOSPITAL_COMMUNITY): Payer: Self-pay | Admitting: Emergency Medicine

## 2013-05-20 ENCOUNTER — Emergency Department (HOSPITAL_COMMUNITY): Payer: Medicare Other

## 2013-05-20 ENCOUNTER — Inpatient Hospital Stay (HOSPITAL_COMMUNITY)
Admission: EM | Admit: 2013-05-20 | Discharge: 2013-05-24 | DRG: 177 | Disposition: A | Discharge status: Hospice | Payer: Medicare Other | Attending: Internal Medicine | Admitting: Internal Medicine

## 2013-05-20 DIAGNOSIS — M810 Age-related osteoporosis without current pathological fracture: Secondary | ICD-10-CM

## 2013-05-20 DIAGNOSIS — Z860100 Personal history of colon polyps, unspecified: Secondary | ICD-10-CM

## 2013-05-20 DIAGNOSIS — G4700 Insomnia, unspecified: Secondary | ICD-10-CM

## 2013-05-20 DIAGNOSIS — N179 Acute kidney failure, unspecified: Secondary | ICD-10-CM

## 2013-05-20 DIAGNOSIS — F0391 Unspecified dementia with behavioral disturbance: Secondary | ICD-10-CM | POA: Diagnosis present

## 2013-05-20 DIAGNOSIS — I5031 Acute diastolic (congestive) heart failure: Secondary | ICD-10-CM

## 2013-05-20 DIAGNOSIS — J690 Pneumonitis due to inhalation of food and vomit: Secondary | ICD-10-CM

## 2013-05-20 DIAGNOSIS — D509 Iron deficiency anemia, unspecified: Secondary | ICD-10-CM

## 2013-05-20 DIAGNOSIS — I369 Nonrheumatic tricuspid valve disorder, unspecified: Secondary | ICD-10-CM

## 2013-05-20 DIAGNOSIS — I079 Rheumatic tricuspid valve disease, unspecified: Secondary | ICD-10-CM | POA: Diagnosis present

## 2013-05-20 DIAGNOSIS — K5900 Constipation, unspecified: Secondary | ICD-10-CM

## 2013-05-20 DIAGNOSIS — J9601 Acute respiratory failure with hypoxia: Secondary | ICD-10-CM | POA: Diagnosis present

## 2013-05-20 DIAGNOSIS — T79A3XA Traumatic compartment syndrome of abdomen, initial encounter: Secondary | ICD-10-CM

## 2013-05-20 DIAGNOSIS — I129 Hypertensive chronic kidney disease with stage 1 through stage 4 chronic kidney disease, or unspecified chronic kidney disease: Secondary | ICD-10-CM | POA: Diagnosis present

## 2013-05-20 DIAGNOSIS — J9600 Acute respiratory failure, unspecified whether with hypoxia or hypercapnia: Secondary | ICD-10-CM

## 2013-05-20 DIAGNOSIS — N183 Chronic kidney disease, stage 3 unspecified: Secondary | ICD-10-CM

## 2013-05-20 DIAGNOSIS — E860 Dehydration: Secondary | ICD-10-CM

## 2013-05-20 DIAGNOSIS — E876 Hypokalemia: Secondary | ICD-10-CM

## 2013-05-20 DIAGNOSIS — R609 Edema, unspecified: Secondary | ICD-10-CM

## 2013-05-20 DIAGNOSIS — J449 Chronic obstructive pulmonary disease, unspecified: Secondary | ICD-10-CM

## 2013-05-20 DIAGNOSIS — E559 Vitamin D deficiency, unspecified: Secondary | ICD-10-CM

## 2013-05-20 DIAGNOSIS — F070 Personality change due to known physiological condition: Secondary | ICD-10-CM

## 2013-05-20 DIAGNOSIS — Z87891 Personal history of nicotine dependence: Secondary | ICD-10-CM

## 2013-05-20 DIAGNOSIS — G309 Alzheimer's disease, unspecified: Secondary | ICD-10-CM | POA: Diagnosis present

## 2013-05-20 DIAGNOSIS — F3289 Other specified depressive episodes: Secondary | ICD-10-CM | POA: Diagnosis present

## 2013-05-20 DIAGNOSIS — J189 Pneumonia, unspecified organism: Secondary | ICD-10-CM

## 2013-05-20 DIAGNOSIS — I10 Essential (primary) hypertension: Secondary | ICD-10-CM

## 2013-05-20 DIAGNOSIS — B961 Klebsiella pneumoniae [K. pneumoniae] as the cause of diseases classified elsewhere: Secondary | ICD-10-CM | POA: Diagnosis present

## 2013-05-20 DIAGNOSIS — A0472 Enterocolitis due to Clostridium difficile, not specified as recurrent: Secondary | ICD-10-CM | POA: Diagnosis not present

## 2013-05-20 DIAGNOSIS — Z66 Do not resuscitate: Secondary | ICD-10-CM | POA: Diagnosis not present

## 2013-05-20 DIAGNOSIS — I509 Heart failure, unspecified: Secondary | ICD-10-CM | POA: Diagnosis present

## 2013-05-20 DIAGNOSIS — F03918 Unspecified dementia, unspecified severity, with other behavioral disturbance: Secondary | ICD-10-CM

## 2013-05-20 DIAGNOSIS — I5032 Chronic diastolic (congestive) heart failure: Secondary | ICD-10-CM | POA: Diagnosis present

## 2013-05-20 DIAGNOSIS — J4489 Other specified chronic obstructive pulmonary disease: Secondary | ICD-10-CM | POA: Diagnosis present

## 2013-05-20 DIAGNOSIS — E785 Hyperlipidemia, unspecified: Secondary | ICD-10-CM

## 2013-05-20 DIAGNOSIS — E872 Acidosis, unspecified: Secondary | ICD-10-CM | POA: Diagnosis present

## 2013-05-20 DIAGNOSIS — F0280 Dementia in other diseases classified elsewhere without behavioral disturbance: Secondary | ICD-10-CM | POA: Diagnosis present

## 2013-05-20 DIAGNOSIS — I739 Peripheral vascular disease, unspecified: Secondary | ICD-10-CM

## 2013-05-20 DIAGNOSIS — F329 Major depressive disorder, single episode, unspecified: Secondary | ICD-10-CM

## 2013-05-20 DIAGNOSIS — I2789 Other specified pulmonary heart diseases: Secondary | ICD-10-CM | POA: Diagnosis present

## 2013-05-20 DIAGNOSIS — N390 Urinary tract infection, site not specified: Secondary | ICD-10-CM

## 2013-05-20 LAB — URINALYSIS, ROUTINE W REFLEX MICROSCOPIC
Bilirubin Urine: NEGATIVE
Glucose, UA: NEGATIVE mg/dL
Ketones, ur: NEGATIVE mg/dL
Nitrite: NEGATIVE
Specific Gravity, Urine: 1.018 (ref 1.005–1.030)
pH: 5 (ref 5.0–8.0)

## 2013-05-20 LAB — CBC WITH DIFFERENTIAL/PLATELET
Basophils Absolute: 0 10*3/uL (ref 0.0–0.1)
Eosinophils Absolute: 0 10*3/uL (ref 0.0–0.7)
Eosinophils Relative: 0 % (ref 0–5)
Lymphocytes Relative: 4 % — ABNORMAL LOW (ref 12–46)
MCV: 98.9 fL (ref 78.0–100.0)
Monocytes Relative: 5 % (ref 3–12)
Neutro Abs: 28.9 10*3/uL — ABNORMAL HIGH (ref 1.7–7.7)
Neutrophils Relative %: 91 % — ABNORMAL HIGH (ref 43–77)
Platelets: 301 10*3/uL (ref 150–400)
RBC: 3.76 MIL/uL — ABNORMAL LOW (ref 3.87–5.11)
RDW: 18.2 % — ABNORMAL HIGH (ref 11.5–15.5)
WBC: 31.8 10*3/uL — ABNORMAL HIGH (ref 4.0–10.5)

## 2013-05-20 LAB — COMPREHENSIVE METABOLIC PANEL
AST: 169 U/L — ABNORMAL HIGH (ref 0–37)
Albumin: 3 g/dL — ABNORMAL LOW (ref 3.5–5.2)
Alkaline Phosphatase: 73 U/L (ref 39–117)
BUN: 42 mg/dL — ABNORMAL HIGH (ref 6–23)
Calcium: 9.5 mg/dL (ref 8.4–10.5)
Chloride: 97 mEq/L (ref 96–112)
Creatinine, Ser: 1.47 mg/dL — ABNORMAL HIGH (ref 0.50–1.10)
Potassium: 4.1 mEq/L (ref 3.5–5.1)
Sodium: 136 mEq/L (ref 135–145)
Total Bilirubin: 1.1 mg/dL (ref 0.3–1.2)
Total Protein: 6.9 g/dL (ref 6.0–8.3)

## 2013-05-20 LAB — CBC
HCT: 34.5 % — ABNORMAL LOW (ref 36.0–46.0)
MCH: 31.9 pg (ref 26.0–34.0)
MCV: 98.3 fL (ref 78.0–100.0)
Platelets: 241 10*3/uL (ref 150–400)
RDW: 18.6 % — ABNORMAL HIGH (ref 11.5–15.5)
WBC: 26.4 10*3/uL — ABNORMAL HIGH (ref 4.0–10.5)

## 2013-05-20 LAB — GLUCOSE, CAPILLARY
Glucose-Capillary: 111 mg/dL — ABNORMAL HIGH (ref 70–99)
Glucose-Capillary: 91 mg/dL (ref 70–99)
Glucose-Capillary: 75 mg/dL (ref 70–99)

## 2013-05-20 LAB — CK TOTAL AND CKMB (NOT AT ARMC)
CK, MB: 3.6 ng/mL (ref 0.3–4.0)
Relative Index: INVALID (ref 0.0–2.5)
Total CK: 37 U/L (ref 7–177)

## 2013-05-20 LAB — PRO B NATRIURETIC PEPTIDE: Pro B Natriuretic peptide (BNP): 23984 pg/mL — ABNORMAL HIGH (ref 0–450)

## 2013-05-20 LAB — PROTIME-INR
INR: 1.44 (ref 0.00–1.49)
Prothrombin Time: 17.2 seconds — ABNORMAL HIGH (ref 11.6–15.2)

## 2013-05-20 LAB — POCT I-STAT 3, ART BLOOD GAS (G3+)
Acid-base deficit: 5 mmol/L — ABNORMAL HIGH (ref 0.0–2.0)
Bicarbonate: 21.4 mEq/L (ref 20.0–24.0)
Patient temperature: 98.6
pH, Arterial: 7.294 — ABNORMAL LOW (ref 7.350–7.450)
pO2, Arterial: 126 mmHg — ABNORMAL HIGH (ref 80.0–100.0)

## 2013-05-20 LAB — HEPARIN LEVEL (UNFRACTIONATED): Heparin Unfractionated: 0.4 IU/mL (ref 0.30–0.70)

## 2013-05-20 LAB — LACTIC ACID, PLASMA: Lactic Acid, Venous: 1 mmol/L (ref 0.5–2.2)

## 2013-05-20 LAB — BASIC METABOLIC PANEL
BUN: 41 mg/dL — ABNORMAL HIGH (ref 6–23)
CO2: 27 mEq/L (ref 19–32)
Calcium: 9.1 mg/dL (ref 8.4–10.5)
Chloride: 98 mEq/L (ref 96–112)
Creatinine, Ser: 1.36 mg/dL — ABNORMAL HIGH (ref 0.50–1.10)
GFR calc Af Amer: 40 mL/min — ABNORMAL LOW (ref >90)

## 2013-05-20 LAB — MAGNESIUM: Magnesium: 2.1 mg/dL (ref 1.5–2.5)

## 2013-05-20 LAB — TROPONIN I
Troponin I: 0.47 ng/mL (ref <0.30)
Troponin I: 0.54 ng/mL (ref <0.30)

## 2013-05-20 LAB — LIPASE, BLOOD: Lipase: 23 U/L (ref 11–59)

## 2013-05-20 LAB — MRSA PCR SCREENING: MRSA by PCR: NEGATIVE

## 2013-05-20 LAB — APTT: aPTT: 37 seconds (ref 24–37)

## 2013-05-20 LAB — POCT I-STAT CREATININE: Creatinine, Ser: 1.9 mg/dL — ABNORMAL HIGH (ref 0.50–1.10)

## 2013-05-20 LAB — URINE MICROSCOPIC-ADD ON

## 2013-05-20 MED ORDER — HEPARIN BOLUS VIA INFUSION
4000.0000 [IU] | Freq: Once | INTRAVENOUS | Status: AC
  Administered 2013-05-20: 4000 [IU] via INTRAVENOUS
  Filled 2013-05-20: qty 4000

## 2013-05-20 MED ORDER — DEXTROSE 5 % IV SOLN
1.0000 g | INTRAVENOUS | Status: DC
  Administered 2013-05-20 – 2013-05-23 (×4): 1 g via INTRAVENOUS
  Filled 2013-05-20 (×5): qty 10

## 2013-05-20 MED ORDER — PIPERACILLIN-TAZOBACTAM IN DEX 2-0.25 GM/50ML IV SOLN: 2.2500 g | Freq: Once | INTRAVENOUS | Status: DC

## 2013-05-20 MED ORDER — DEXTROSE-NACL 5-0.45 % IV SOLN
INTRAVENOUS | Status: DC
  Administered 2013-05-20: 23:00:00 via INTRAVENOUS

## 2013-05-20 MED ORDER — FUROSEMIDE 10 MG/ML IJ SOLN
80.0000 mg | Freq: Once | INTRAMUSCULAR | Status: AC
  Administered 2013-05-20: 80 mg via INTRAVENOUS
  Filled 2013-05-20: qty 8

## 2013-05-20 MED ORDER — VANCOMYCIN HCL IN DEXTROSE 1-5 GM/200ML-% IV SOLN
1000.0000 mg | Freq: Once | INTRAVENOUS | Status: AC
  Administered 2013-05-20: 1000 mg via INTRAVENOUS
  Filled 2013-05-20: qty 200

## 2013-05-20 MED ORDER — DEXTROSE 5 % IV SOLN
500.0000 mg | INTRAVENOUS | Status: DC
  Administered 2013-05-20: 500 mg via INTRAVENOUS
  Filled 2013-05-20 (×3): qty 500

## 2013-05-20 MED ORDER — SODIUM CHLORIDE 0.9 % IV BOLUS (SEPSIS)
500.0000 mL | Freq: Once | INTRAVENOUS | Status: AC
  Administered 2013-05-20: 500 mL via INTRAVENOUS

## 2013-05-20 MED ORDER — TIOTROPIUM BROMIDE MONOHYDRATE 18 MCG IN CAPS
18.0000 ug | ORAL_CAPSULE | Freq: Every day | RESPIRATORY_TRACT | Status: DC
  Administered 2013-05-22 – 2013-05-24 (×3): 18 ug via RESPIRATORY_TRACT
  Filled 2013-05-20 (×2): qty 5

## 2013-05-20 MED ORDER — PANTOPRAZOLE SODIUM 40 MG IV SOLR
40.0000 mg | INTRAVENOUS | Status: DC
  Administered 2013-05-20 – 2013-05-22 (×3): 40 mg via INTRAVENOUS
  Filled 2013-05-20 (×4): qty 40

## 2013-05-20 MED ORDER — ASPIRIN 81 MG PO CHEW
324.0000 mg | CHEWABLE_TABLET | Freq: Once | ORAL | Status: AC
  Administered 2013-05-20: 324 mg via ORAL
  Filled 2013-05-20: qty 4

## 2013-05-20 MED ORDER — SODIUM CHLORIDE 0.9 % IV BOLUS (SEPSIS)
1000.0000 mL | Freq: Once | INTRAVENOUS | Status: AC
  Administered 2013-05-20: 1000 mL via INTRAVENOUS

## 2013-05-20 MED ORDER — HEPARIN (PORCINE) IN NACL 100-0.45 UNIT/ML-% IJ SOLN
1250.0000 [IU]/h | INTRAMUSCULAR | Status: DC
  Administered 2013-05-20: 1150 [IU]/h via INTRAVENOUS
  Administered 2013-05-20: 1000 [IU]/h via INTRAVENOUS
  Filled 2013-05-20 (×2): qty 250

## 2013-05-20 MED ORDER — MOMETASONE FURO-FORMOTEROL FUM 100-5 MCG/ACT IN AERO
2.0000 | INHALATION_SPRAY | Freq: Two times a day (BID) | RESPIRATORY_TRACT | Status: DC
  Administered 2013-05-21 – 2013-05-24 (×7): 2 via RESPIRATORY_TRACT
  Filled 2013-05-20 (×3): qty 8.8

## 2013-05-20 MED ORDER — ALBUTEROL SULFATE HFA 108 (90 BASE) MCG/ACT IN AERS: 2.0000 | INHALATION_SPRAY | RESPIRATORY_TRACT | Status: DC | PRN

## 2013-05-20 MED ORDER — PIPERACILLIN-TAZOBACTAM 3.375 G IVPB 30 MIN
3.3750 g | Freq: Once | INTRAVENOUS | Status: DC
  Administered 2013-05-20: 3.375 g via INTRAVENOUS
  Filled 2013-05-20: qty 50

## 2013-05-20 MED ORDER — SODIUM CHLORIDE 0.9 % IV SOLN
250.0000 mL | INTRAVENOUS | Status: DC | PRN
  Administered 2013-05-20: 250 mL via INTRAVENOUS

## 2013-05-20 NOTE — Progress Notes (Signed)
**Note De-Identified  Obfuscation** RT note: patient removed from BIPAP for a trial period; currently tolerating 4L [place] SAT 100%.

## 2013-05-20 NOTE — Progress Notes (Signed)
*  PRELIMINARY RESULTS* Vascular Ultrasound Lower extremity venous duplex has been completed.  Preliminary findings: no evidence of DVT in visualized veins  [name], Patricia Roberson 05/20/2013, 6:20 PM

## 2013-05-20 NOTE — Progress Notes (Signed)
UR Completed.  Patricia Roberson [phone] 05/20/2013  

## 2013-05-20 NOTE — ED Provider Notes (Signed)
CSN: [id number]     Arrival date & time 05/20/13  0105 History   First MD Initiated Contact with Patient 05/20/13 0119     Chief Complaint  Patient presents with  . Loss of Consciousness    nurisng facilty preformed cpr for approx. 6 minutes    (Consider location/radiation/quality/duration/timing/severity/associated sxs/prior Treatment) HPI This patient is an elderly SNF resident who is BIB EMS after an episode of syncope v. Cardiac arrest. I obtained hx from EMS. They were told by facility staff that the patient had a BM on commode and then stood up and collapsed. Staff member, reportedly, could not detect a pulse and CPR was performed for approximately 50m before paramedics arrived.   On arrival, the patient was found to have good peripheral pulses but appeared tachypneic and had RA sats of 83%. She was placed on 15L O2 vis NRB and brought to the ED. Her CBG was wnl. Her 12 lead via EMS showed sinus rhythm without signs of STEMI.   There is no family at bedside. Paperwork indicates that the patient is FULL CODE. I am unable to obtain history from the patient due to AMS and acute respiratory distress.    Past Medical History  Diagnosis Date  . Depression   . Hypertension   . CHF (congestive heart failure)   . Alzheimer's disease   . Edema   . Dementia in conditions classified elsewhere with behavioral disturbance(294.11)    No past surgical history on file. No family history on file. History  Substance Use Topics  . Smoking status: Not on file  . Smokeless tobacco: Not on file  . Alcohol Use: Not on file   OB History   Grav Para Term Preterm Abortions TAB SAB Ect Mult Living                 Review of Systems Unable to obtain from the patient due to AMS and respiratory distress - level V caveat applies.   Allergies  Codeine and Morphine  Home Medications  No current outpatient prescriptions on file. BP 138/101  Pulse 96  Temp(Src) 98.5 F (36.9 C) (Axillary)  Resp 30   SpO2 100% Physical Exam Gen: well developed and well nourished appearing, appears acutely ill Head: NCAT Eyes: PERL, EOMI Nose: no epistaixis or rhinorrhea Mouth/throat: mucosa is moist and pink, no signs of intra-oral trauma, gag reflex is present.  Neck: supple, no stridor, no JVC Lungs: CTA B, RR 44 to 48/min, accessory muscle use and retractions, BS diminished both bases.  no wheezing, rhonchi or rales.  CV: RRR - pulse 92 bpm, good peripheral pulses - equal, cap refill 2s.  Abd: soft, notender, nondistended Back: kyphosis, otherwise normal to inspection.  Skin: warm and dry Neuro: opens eyes spontaneously, unintelligible sounds, moves extremities spontaneously and withdraws to noxious stimuli, unable to perform more sophisticated neuro exam due to AMS and respiratory distress.  Psyche: unable to assess Ext: no dependent edema appreciated  ED Course  Procedures (including critical care time) Labs Review  EKG: NSR, rate 97 bpm, prolonged QT interval with QTc of 530 ms, normal axis, ST depression present in lateral leads but appears largely unchanged from comparison EKG, interval development of diffuse T wave inversion in the anterolateral leads when compared to 03/05/11 EKG.   *RADIOLOGY REPORT*  Clinical Data: Loss of consciousness, respiratory distress.  PORTABLE CHEST - 1 VIEW  Comparison: Chest radiograph March 05, 2011  Findings: Cardiac silhouette appears moderately enlarged, similar. Mildly calcified aortic  knob. Mediastinal silhouette is not suspicious. Mild chronic interstitial changes similar with blunting of the left costophrenic angle, unchanged. Central pulmonary vascular engorgement. No pneumothorax though, lung apices obscured by facial structures.  Multiple EKG lines overlay the patient and could obscure underlying subtle pathology. Soft tissue planes and included osseous structures are not suspicious.  IMPRESSION: Stable cardiomegaly and central  pulmonary vasculature congestion. Mild chronic interstitial changes, blunting of the left costophrenic angle favors pleural thickening as this is similar to prior examination.    MDM   Patient presents with acute respiratory failure following vasovagal syncopal event vs. Cardiogenic syncope vs. Cardiac arrest. We are starting the patient on BiPAP in hopes of avoiding intubation and her RR is in the 40s and she is clearly developing respiratory fatigue. She has a metabolic acidosis with lactate of 6 - consistent with cardiac arrest v. Sepsis. Rectal temp is wnl. BP is wnl.   CXR shows stable cardiomegaly and mild vascular congestion with unchanged blunting of the costophrenic angle - cause of respiratory failure not identified on CXR. PE is within our differential but, the patient has acute renal failure and we are thus unable to obtain definitive study - CTA chest. Patient unable to tolerate V/Q scan. Tpn mildly elevated. Again, this may be primary cardiac event. Patient may also have aspirated. Following head CT (if negative for hemorrhage), we will anticoagulate with heparin for empiric tx of both ACS and PE.    We are covering empirically for aspiration and HCAP with Zosyn and Vancomycin.   Patient will require ICU admission.   1610:  Further hx obtained from nurse, Luster Landsberg, at [facility], who witnessed event and is familiar with the patient. She states that the patient had been resting comfortably with she, the nurse, came on shift for the evening. The patient was assisted to the bathroom by a nursing aide and had a BM. She was ambulating back to bed with assitance and became very tachypneic with increase WOB. Sats were checked and found to be 81%.  Patient wears 2L O2 via Ada prn at night but not during the day. 81% was on 2L. Patient bumped up to 3L. Her condition appeared to worsen and her extremities became cool and pulses thready.   Luster Landsberg says that patient then lost conciosness while  in bed and pulses could not be detected. Staff performed CPR for approx 5 to 6 m before paramedics arrived. Patient was found to have pulses when paramedics evaluated her. She also endorsed chest pain, per nurse Renee.   At baseline, the patient is alert and oriented to person and place. She is typically socially withdrawn, sometimes agitated and anxious. She ambulates using a walker with a one to one assist.   0242: Head CT: NAICP. We will initiate tx with heparin. On re-exam, patient has JVD to the angle of the mandible. Lung sounds still clear and RR has slowed to mid 20s on BiPAP, patient appears much more comfortable. My suspicion is that this is a primary cardiac event with associated flash pulmonary edema. BP remains wnl. We will tx with IV lasix and hold IVF. Case discussed with ICU attending and I have requested evaluation for ICU admission.   CRITICAL CARE Performed by: Brandt Loosen   Total critical care time: 95 minutes  Critical care time was exclusive of separately billable procedures and treating other patients.  Critical care was necessary to treat or prevent imminent or life-threatening deterioration.  Critical care was time spent personally  by me on the following activities: development of treatment plan with patient and/or surrogate as well as nursing, discussions with consultants, evaluation of patient's response to treatment, examination of patient, obtaining history from patient or surrogate, ordering and performing treatments and interventions, ordering and review of laboratory studies, ordering and review of radiographic studies, pulse oximetry and re-evaluation of patient's condition.   Brandt Loosen, MD 05/20/13 [phone]

## 2013-05-20 NOTE — H&P (Signed)
PULMONARY  / CRITICAL CARE MEDICINE  Name: Patricia Roberson MRN: [id number] DOB: Aug 29, 1926    ADMISSION DATE:  05/20/2013  PRIMARY SERVICE: PCCM  CHIEF COMPLAINT:  Post syncope, questionable cardiac arrest with return to spontaneous circulation after 6 minutes. Respiratory failure.  BRIEF PATIENT DESCRIPTION:  79 years old female with PMH relevant for diastolic CHF, HTN, COPD, dementia. Resident of a SNF. Brought by EMS after collapsing and received CPR for 6 minutes for questionable cardiac arrest. At arrival breathing spontaneously on face mask but hypoxic.   SIGNIFICANT EVENTS / STUDIES:  - CT scan of the head with no acute intracranial process  LINES / TUBES: - Peripheral IV's  CULTURES: Blood cultures sent Urine cultures sent  ANTIBIOTICS: - Got a dose of Zosyn and vancomycin in the ED - Will start Rocephin and azithromycin  HISTORY OF PRESENT ILLNESS:   79 years old female with PMH relevant for diastolic CHF, HTN, COPD, dementia. Resident of a SNF. Apparently was in her usual state of health and collapsed after she stood up from the commode. Apparently she lost her pulse and CPR was given for about 6 minutes. At arrival of EMS she had pulses but was hypoxic. At arrival to the ED she was saturating 83% on RA and in severe respiratory distress. She was started on BiPAP with improvement of her respiratory distress. She was also given lasix 80 mg IV for possible CHF and was started on heparin drip for high suspicion of PE. She received a dose of Vancomycin and Zosyn. At the time of my examination the patient is awake but obtunded. She is hemodynamically stable on sinus rhythm, breathing comfortably at 24/min, saturating 98% on 50% FiO2, on BiPAP 12/6.  PAST MEDICAL HISTORY :  Past Medical History  Diagnosis Date  . Depression   . Hypertension   . CHF (congestive heart failure)   . Alzheimer's disease   . Edema   . Dementia in conditions classified elsewhere with behavioral  disturbance(294.11)    History reviewed. No pertinent past surgical history. Prior to Admission medications   Medication Sig Start Date End Date Taking? Authorizing Provider  acetaminophen (TYLENOL) 325 MG tablet Take 650 mg by mouth 2 (two) times daily.   Yes Historical Provider, MD  alendronate (FOSAMAX) 70 MG tablet Take 70 mg by mouth every 7 (seven) days. Take with a full glass of water on an empty stomach. Takes on Saturday   Yes Historical Provider, MD  atorvastatin (LIPITOR) 10 MG tablet Take 5 mg by mouth at bedtime.   Yes Historical Provider, MD  Calcium Carb-Cholecalciferol 500-600 MG-UNIT CHEW Chew 1 tablet by mouth 2 (two) times daily with a meal.   Yes Historical Provider, MD  chlorhexidine (PERIDEX) 0.12 % solution Use as directed 15 mLs in the mouth or throat 2 (two) times daily.   Yes Historical Provider, MD  Cholecalciferol (VITAMIN D-3) 1000 UNITS CAPS Take 1,000 Units by mouth daily.   Yes Historical Provider, MD  donepezil (ARICEPT) 5 MG tablet Take 5 mg by mouth at bedtime.   Yes Historical Provider, MD  DULoxetine (CYMBALTA) 60 MG capsule Take 60 mg by mouth daily.   Yes Historical Provider, MD  feeding supplement, RESOURCE BREEZE, (RESOURCE BREEZE) LIQD Take 60 mLs by mouth 2 (two) times daily.   Yes Historical Provider, MD  fluticasone (FLONASE) 50 MCG/ACT nasal spray Place 2 sprays into the nose daily.   Yes Historical Provider, MD  Fluticasone-Salmeterol (ADVAIR) 250-50 MCG/DOSE AEPB Inhale 1 puff  into the lungs every 12 (twelve) hours.   Yes Historical Provider, MD  furosemide (LASIX) 40 MG tablet Take 40 mg by mouth daily.   Yes Historical Provider, MD  ipratropium-albuterol (DUONEB) 0.5-2.5 (3) MG/3ML SOLN Take 3 mLs by nebulization every 6 (six) hours as needed (for shortness of breath).   Yes Historical Provider, MD  memantine (NAMENDA) 10 MG tablet Take 10 mg by mouth 2 (two) times daily.   Yes Historical Provider, MD  metoprolol tartrate (LOPRESSOR) 25 MG tablet  Take 6.25 mg by mouth 2 (two) times daily.   Yes Historical Provider, MD  nitrofurantoin, macrocrystal-monohydrate, (MACROBID) 100 MG capsule Take 100 mg by mouth 2 (two) times daily. For 7 days 05/17/13 05/23/13 Yes Historical Provider, MD  polyethylene glycol (MIRALAX / GLYCOLAX) packet Take 17 g by mouth 2 (two) times daily.   Yes Historical Provider, MD  spironolactone (ALDACTONE) 50 MG tablet Take 50 mg by mouth daily.   Yes Historical Provider, MD  tiotropium (SPIRIVA) 18 MCG inhalation capsule Place 18 mcg into inhaler and inhale daily.   Yes Historical Provider, MD   Allergies  Allergen Reactions  . Codeine Other (See Comments)    unknown  . Levaquin [Levofloxacin] Other (See Comments)    unknown  . Morphine Other (See Comments)    unknown    FAMILY HISTORY:  No family history on file. SOCIAL HISTORY:  has no tobacco, alcohol, and drug history on file.  REVIEW OF SYSTEMS:  Unable to provide.  SUBJECTIVE:   VITAL SIGNS: Temp:  [98.5 F (36.9 C)-99.5 F (37.5 C)] 99.5 F (37.5 C) (10/13 0317) Pulse Rate:  [88-96] 91 (10/13 0345) Resp:  [21-43] 21 (10/13 0345) BP: (113-140)/(48-101) 121/58 mmHg (10/13 0345) SpO2:  [84 %-100 %] 92 % (10/13 0345) FiO2 (%):  [50 %] 50 % (10/13 0413) Weight:  [189 lb 9.5 oz (86 kg)] 189 lb 9.5 oz (86 kg) (10/13 0203) HEMODYNAMICS:   VENTILATOR SETTINGS: Vent Mode:  [-] BIPAP FiO2 (%):  [50 %] 50 % Set Rate:  [10 bmp] 10 bmp INTAKE / OUTPUT: Intake/Output     10/12 0701 - 10/13 0700   I.V. (mL/kg) 500 (5.8)   Total Intake(mL/kg) 500 (5.8)   Net +500         PHYSICAL EXAMINATION: General: Elderly patient on BiPAP, no distress Eyes: Anicteric sclerae. ENT: Oropharynx clear. Dry mucous membranes. No thrush Lymph: No cervical, supraclavicular, or axillary lymphadenopathy. Heart: Normal S1, S2. No murmurs, rubs, or gallops appreciated. No bruits, equal pulses. Lungs: Normal excursion, no dullness to percussion. Good air movement  bilaterally, bilateral basilar crackles. Abdomen: Abdomen soft, non-tender and not distended, normoactive bowel sounds. No hepatosplenomegaly or masses. Musculoskeletal: No clubbing or synovitis. Has 1 to 2 + LE edema Skin: No rashes or lesions Neuro: No focal neurologic deficits. Baseline dementia. Answers simple questions and follows commands.  LABS:  CBC Recent Labs     05/20/13  0122  WBC  31.8*  HGB  12.2  HCT  37.2  PLT  301   Coag's Recent Labs     05/20/13  0230  APTT  37  INR  1.44   BMET Recent Labs     05/20/13  0122  05/20/13  0139  NA  136   --   K  4.1   --   CL  97   --   CO2  20   --   BUN  42*   --   CREATININE  1.47*  1.90*  GLUCOSE  120*   --    Electrolytes Recent Labs     05/20/13  0122  05/20/13  0155  CALCIUM  9.5   --   MG   --   2.1   Sepsis Markers No results found for this basename: LACTICACIDVEN, PROCALCITON, O2SATVEN,  in the last 72 hours ABG Recent Labs     05/20/13  0129  PHART  7.294*  PCO2ART  44.0  PO2ART  126.0*   Liver Enzymes Recent Labs     05/20/13  0122  AST  169*  ALT  207*  ALKPHOS  73  BILITOT  1.1  ALBUMIN  3.0*   Cardiac Enzymes Recent Labs     05/20/13  0122  PROBNP  23984.0*   Glucose Recent Labs     05/20/13  0115  GLUCAP  139*    Imaging Ct Head Wo Contrast  05/20/2013   *RADIOLOGY REPORT*  Clinical Data: Loss of consciousness.  CT HEAD WITHOUT CONTRAST  Technique:  Contiguous axial images were obtained from the base of the skull through the vertex without contrast.  Comparison: CT of head March 06, 2011  Findings: The ventricles and sulci are overall normal for age.  No intraparenchymal hemorrhage, mass effect nor midline shift. Remote bilateral basal ganglial lacunar infarcts, with mild ex vacuo dilatation of the left greater than right frontal horns.  Patchy supratentorial white matter hypodensities are within normal range for patient's age and though non-specific suggest sequalae of  chronic small vessel ischemic disease. No acute large vascular territory infarcts.  No abnormal extra-axial fluid collections.  Basal cisterns are patent. Moderate calcific atherosclerosis of the carotid siphons and vertebral basilar system, similar.  No skull fracture.  Visualized paranasal sinuses and mastoid aircells are well-aerated.  The included ocular globes and orbital contents are non-suspicious.  IMPRESSION: No acute intracranial process.  Stable appearance of head:  Remote bilateral basal ganglia lacunar infarcts, mild to moderate white matter changes suggest chronic small vessel ischemic disease.   Original Report Authenticated By: Awilda Metro   Dg Chest Portable 1 View  05/20/2013   *RADIOLOGY REPORT*  Clinical Data: Loss of consciousness, respiratory distress.  PORTABLE CHEST - 1 VIEW  Comparison: Chest radiograph March 05, 2011  Findings: Cardiac silhouette appears moderately enlarged, similar. Mildly calcified aortic knob.  Mediastinal silhouette is not suspicious.  Mild chronic interstitial changes similar with blunting of the left costophrenic angle, unchanged.  Central pulmonary vascular engorgement.  No pneumothorax though, lung apices obscured by facial structures.  Multiple EKG lines overlay the patient and could obscure underlying subtle pathology.  Soft tissue planes and included osseous structures are not suspicious.  IMPRESSION: Stable cardiomegaly and central pulmonary vasculature congestion. Mild chronic interstitial changes, blunting of the left costophrenic angle favors pleural thickening as this is similar to prior examination.   Original Report Authenticated By: Awilda Metro     CXR:  - Bilaterally increased interstitial markings. No focal infiltrates.   ASSESSMENT / PLAN:  PULMONARY A: 1) Acute hypoxemic respiratory failure of sudden onset, unclear etiology. In the differential are PE, primary cardiac event, flash pulmonary edema. Less likely pneumonia since  there are no focal infiltrates on X ray and this was sudden.  P:   - Will continue BiPAP 12/6, FiO2 50% - Will get V/Q scan. Not a candidate for CTA because of renal failure.  - Will continue empiric anticoagulation with heparin drip - We will continue home Advair and Spiriva -  Albuterol PRN  CARDIOVASCULAR A:  1) Questionable cardiac arrest with return to spontaneous circulation after 6 minutes of CPR. Lactate of 6. 2) Elevated troponin with non specific EKG changes 3) Diastolic congestive heart failure with markedly elevated BNP. Personally performed bedside echo and the patient has a normal LVEF. P:  - Got 80 mg of lasix in the ED, will follow response. - Will trend troponin - Will follow lactate - On heparin drip - Will hold PO lasix and metoprolol.  RENAL A:   1) Acute renal failure, likely pre renal 2) Urinary tract infection P:   - Will follow BMP - On Rocephin  GASTROINTESTINAL A:   1) No issues   HEMATOLOGIC A:   1) No issues P:  - Will follow CBC  INFECTIOUS A:   1) Urinary tract infection 2) Suspicion for pneumonia is low, no acute infiltrates on X ray. Will cover for atypicals P:   - Follow blood and urine cultures - Rocephin / azithromycin  ENDOCRINE A:   1) No history of diabetes  NEUROLOGIC A:   1) Baseline dementia P:   - Will hold home medications while NPO but likely will restart once she is of the BiPAP   I have personally obtained a history, examined the patient, evaluated laboratory and imaging results, formulated the assessment and plan and placed orders. CRITICAL CARE: Critical Care Time devoted to patient care services described in this note is 60 minutes.   Overton Mam, MD Pulmonary and Critical Care Medicine [facility] Pager: [phone]  05/20/2013, 4:16 AM

## 2013-05-20 NOTE — ED Notes (Signed)
Spoke with critical care MD. Molli Knock to have Perf and Vent completed later this morning when tech arrives.

## 2013-05-20 NOTE — Clinical Social Work Note (Signed)
Pt from [facility], [place].  CSW spoke with Jodie Echevaria, admissions coordinator to confirm pt's return.    Vickii Penna, LCSWA [phone]  Clinical Social Work

## 2013-05-20 NOTE — Progress Notes (Signed)
ANTICOAGULATION CONSULT NOTE - Initial Consult  Pharmacy Consult for heparin  Indication: chest pain/ACS  Allergies  Allergen Reactions  . Codeine Other (See Comments)    unknown  . Levaquin [Levofloxacin] Other (See Comments)    unknown  . Morphine Other (See Comments)    unknown    Patient Measurements: Weight: 189 lb 9.5 oz (86 kg) Heparin Dosing Weight:  Vital Signs: Temp: 98.5 F (36.9 C) (10/13 0118) Temp src: Axillary (10/13 0118) BP: 113/59 mmHg (10/13 0145) Pulse Rate: 92 (10/13 0145)  Labs:  Recent Labs  05/20/13 0122 05/20/13 0139  HGB 12.2  --   HCT 37.2  --   PLT 301  --   CREATININE 1.47* 1.90*  CKTOTAL 37  --   CKMB 3.6  --     The CrCl is unknown because both a height and weight (above a minimum accepted value) are required for this calculation.   Medical History: Past Medical History  Diagnosis Date  . Depression   . Hypertension   . CHF (congestive heart failure)   . Alzheimer's disease   . Edema   . Dementia in conditions classified elsewhere with behavioral disturbance(294.11)     Medications:  No anticoagulants listed PTA  (Not in a hospital admission)  Assessment: 79 yo female with syncopal episode vs cardiac arrest while having BM. Heparin for acs. Goal of Therapy:  Heparin level 0.3-0.7 units/ml Monitor platelets by anticoagulation protocol: Yes   Plan:  Heparin 4000 unitsx1 then 1000 units/hr. 8 hour HL  Daily HL and cbc starting 10/14  Janice Coffin 05/20/2013,2:38 AM

## 2013-05-20 NOTE — Progress Notes (Signed)
eLink Physician-Brief Progress Note Patient Name: Patricia Roberson DOB: Dec 16, 1926 MRN: [id number]  Date of Service  05/20/2013   HPI/Events of Note  Progressive drop in sugar Now 75. She is on NS at k[facility]. No TF   eICU Interventions  Change to d5 half normal saline at kvo   Intervention Category Minor Interventions: Clinical assessment - ordering diagnostic tests  Syndi Pua 05/20/2013, 10:30 PM

## 2013-05-20 NOTE — ED Notes (Signed)
Per ems- pt was in the process of using the bathroom and stood up and passed out. Facility personnel preformed cpr approx 6 mins. Pt was arousalble when ems arrived and has pulse. Pt cbg was 202 when ems assessed

## 2013-05-20 NOTE — Progress Notes (Signed)
PULMONARY  / CRITICAL CARE MEDICINE  Name: Patricia Roberson MRN: [id number] DOB: May 06, 1927    ADMISSION DATE:  05/20/2013  PRIMARY SERVICE: PCCM  CHIEF COMPLAINT:  Resp arrest  BRIEF PATIENT DESCRIPTION:  79 years old female with PMH relevant for diastolic CHF, HTN, COPD, dementia. Resident of a SNF. Brought by EMS after collapsing and received CPR for 6 minutes for questionable cardiac arrest. At arrival breathing spontaneously on face mask but hypoxic.   SIGNIFICANT EVENTS / STUDIES:  CT scan of the head 10/12 >> with no acute intracranial process  LINES / TUBES: Peripheral IV's  CULTURES: Blood cultures 10/13 >>  Urine cultures 10/13 >>   ANTIBIOTICS: Single dose Zosyn and vancomycin in the ED Rocephin 10/13 >> Azithromycin 10/13 >>   SUBJECTIVE:  Interacting on BiPAP weak  VITAL SIGNS: Temp:  [98.4 F (36.9 C)-99.5 F (37.5 C)] 98.9 F (37.2 C) (10/13 1221) Pulse Rate:  [77-96] 78 (10/13 1400) Resp:  [17-43] 20 (10/13 1400) BP: (103-143)/(46-101) 112/58 mmHg (10/13 1400) SpO2:  [84 %-100 %] 91 % (10/13 1400) FiO2 (%):  [50 %] 50 % (10/13 1400) Weight:  [84 kg (185 lb 3 oz)-86 kg (189 lb 9.5 oz)] 84 kg (185 lb 3 oz) (10/13 0530) HEMODYNAMICS:   VENTILATOR SETTINGS: Vent Mode:  [-] BIPAP FiO2 (%):  [50 %] 50 % Set Rate:  [10 bmp] 10 bmp INTAKE / OUTPUT: Intake/Output     10/12 0701 - 10/13 0700 10/13 0701 - 10/14 0700   I.V. (mL/kg) 605.3 (7.2) 239.8 (2.9)   IV Piggyback 50 250   Total Intake(mL/kg) 655.3 (7.8) 489.8 (5.8)   Urine (mL/kg/hr) 475 800 (1.2)   Total Output 475 800   Net +180.3 -310.2          PHYSICAL EXAMINATION: General: Elderly patient on BiPAP, no distress Eyes: Anicteric sclerae. ENT: Oropharynx clear. Dry mucous membranes. No thrush Lymph: No cervical, supraclavicular, or axillary lymphadenopathy. Heart: Normal S1, S2. No murmurs, rubs, or gallops appreciated. No bruits, equal pulses. Lungs: Normal excursion, no dullness to  percussion. Good air movement bilaterally, bilateral basilar crackles. Abdomen: Abdomen soft, non-tender and not distended, normoactive bowel sounds. No hepatosplenomegaly or masses. Musculoskeletal: No clubbing or synovitis. Has 1 to 2 + LE edema Skin: No rashes or lesions Neuro: No focal neurologic deficits. Baseline dementia. Answers simple questions and follows commands.  LABS:  CBC Recent Labs     05/20/13  0122  05/20/13  0650  WBC  31.8*  26.4*  HGB  12.2  11.2*  HCT  37.2  34.5*  PLT  301  241   Coag's Recent Labs     05/20/13  0230  APTT  37  INR  1.44   BMET Recent Labs     05/20/13  0122  05/20/13  0139  05/20/13  0650  NA  136   --   137  K  4.1   --   3.5  CL  97   --   98  CO2  20   --   27  BUN  42*   --   41*  CREATININE  1.47*  1.90*  1.36*  GLUCOSE  120*   --   127*   Electrolytes Recent Labs     05/20/13  0122  05/20/13  0155  05/20/13  0650  CALCIUM  9.5   --   9.1  MG   --   2.1   --    Sepsis  Markers No results found for this basename: LACTICACIDVEN, PROCALCITON, O2SATVEN,  in the last 72 hours ABG Recent Labs     05/20/13  0129  PHART  7.294*  PCO2ART  44.0  PO2ART  126.0*   Liver Enzymes Recent Labs     05/20/13  0122  AST  169*  ALT  207*  ALKPHOS  73  BILITOT  1.1  ALBUMIN  3.0*   Cardiac Enzymes Recent Labs     05/20/13  0122  05/20/13  0650  05/20/13  1015  TROPONINI   --   0.47*  0.60*  PROBNP  23984.0*   --    --    Glucose Recent Labs     05/20/13  0115  05/20/13  1142  GLUCAP  139*  111*    Imaging Ct Head Wo Contrast  05/20/2013   *RADIOLOGY REPORT*  Clinical Data: Loss of consciousness.  CT HEAD WITHOUT CONTRAST  Technique:  Contiguous axial images were obtained from the base of the skull through the vertex without contrast.  Comparison: CT of head March 06, 2011  Findings: The ventricles and sulci are overall normal for age.  No intraparenchymal hemorrhage, mass effect nor midline shift. Remote  bilateral basal ganglial lacunar infarcts, with mild ex vacuo dilatation of the left greater than right frontal horns.  Patchy supratentorial white matter hypodensities are within normal range for patient's age and though non-specific suggest sequalae of chronic small vessel ischemic disease. No acute large vascular territory infarcts.  No abnormal extra-axial fluid collections.  Basal cisterns are patent. Moderate calcific atherosclerosis of the carotid siphons and vertebral basilar system, similar.  No skull fracture.  Visualized paranasal sinuses and mastoid aircells are well-aerated.  The included ocular globes and orbital contents are non-suspicious.  IMPRESSION: No acute intracranial process.  Stable appearance of head:  Remote bilateral basal ganglia lacunar infarcts, mild to moderate white matter changes suggest chronic small vessel ischemic disease.   Original Report Authenticated By: Awilda Metro   Dg Chest Portable 1 View  05/20/2013   *RADIOLOGY REPORT*  Clinical Data: Loss of consciousness, respiratory distress.  PORTABLE CHEST - 1 VIEW  Comparison: Chest radiograph March 05, 2011  Findings: Cardiac silhouette appears moderately enlarged, similar. Mildly calcified aortic knob.  Mediastinal silhouette is not suspicious.  Mild chronic interstitial changes similar with blunting of the left costophrenic angle, unchanged.  Central pulmonary vascular engorgement.  No pneumothorax though, lung apices obscured by facial structures.  Multiple EKG lines overlay the patient and could obscure underlying subtle pathology.  Soft tissue planes and included osseous structures are not suspicious.  IMPRESSION: Stable cardiomegaly and central pulmonary vasculature congestion. Mild chronic interstitial changes, blunting of the left costophrenic angle favors pleural thickening as this is similar to prior examination.   Original Report Authenticated By: Awilda Metro     CXR:  - Bilaterally increased  interstitial markings. No focal infiltrates.   ASSESSMENT / PLAN:  PULMONARY A: 1) Acute hypoxemic respiratory failure of sudden onset, unclear etiology. Consider PE, primary cardiac event, flash pulmonary edema. Less likely pneumonia since there are no focal infiltrates on X ray and this was sudden.  P:   - Will continue BiPAP 12/6, FiO2 50% - Not a candidate for CTA because of renal failure. Unable to perform V/q scan 10/13 due to weakness - Will continue empiric anticoagulation with heparin drip, check LE dopplers - We will continue home Advair and Spiriva - Albuterol PRN  CARDIOVASCULAR A:  1) Questionable cardiac  arrest with return to spontaneous circulation after 6 minutes of CPR. 2) Elevated troponin with non specific EKG changes post arrest 3) Diastolic congestive heart failure with markedly elevated BNP. P:  - hold any further diuresis - Will trend troponin - On heparin drip for now, consider d/c if no DVT on LE dopplers - holding PO lasix and metoprolol.  RENAL A:   1) Acute renal failure, likely pre renal, peak S Cr 1.9 2) Urinary tract infection P:   - Will follow BMP - On Rocephin  GASTROINTESTINAL A:   1) No issues   HEMATOLOGIC A:   1) No issues P:  - Will follow CBC  INFECTIOUS A:   1) Urinary tract infection 2) Suspicion for pneumonia is low, no acute infiltrates on X ray. Will cover for atypicals P:   - Follow blood and urine cultures - Rocephin / azithromycin, likely d/c on 10/14  ENDOCRINE A:   1) No history of diabetes  NEUROLOGIC A:   1) Baseline dementia P:   - Will hold home medications while NPO but plan restart once she is off the BiPAP   I have personally obtained a history, examined the patient, evaluated laboratory and imaging results, formulated the assessment and plan and placed orders. CRITICAL CARE: Critical Care Time devoted to patient care services described in this note is 40 minutes.   Levy Pupa, MD,  PhD 05/20/2013, 3:08 PM [facility] [phone] or if no answer [phone]

## 2013-05-20 NOTE — Progress Notes (Signed)
  Echocardiogram 2D Echocardiogram has been performed.  Patricia Roberson 05/20/2013, 10:52 AM

## 2013-05-20 NOTE — ED Notes (Signed)
ICU MD at bedside

## 2013-05-20 NOTE — Progress Notes (Signed)
ANTICOAGULATION CONSULT NOTE - Follow Up Consult  Pharmacy Consult for Heparin Indication: r/o ACS/PE  Allergies  Allergen Reactions  . Codeine Other (See Comments)    unknown  . Levaquin [Levofloxacin] Other (See Comments)    unknown  . Morphine Other (See Comments)    unknown    Patient Measurements: Height: 6' 3.98" (193 cm) Weight: 185 lb 3 oz (84 kg) IBW/kg (Calculated) : 82.26 Heparin Dosing Weight: 84 kg  Vital Signs: Temp: 97.9 F (36.6 C) (10/13 1500) Temp src: Oral (10/13 1500) BP: 124/52 mmHg (10/13 1800) Pulse Rate: 86 (10/13 1800)  Labs:  Recent Labs  05/20/13 0122 05/20/13 0139 05/20/13 0230 05/20/13 0650 05/20/13 1015 05/20/13 1648 05/20/13 1813  HGB 12.2  --   --  11.2*  --   --   --   HCT 37.2  --   --  34.5*  --   --   --   PLT 301  --   --  241  --   --   --   APTT  --   --  37  --   --   --   --   LABPROT  --   --  17.2*  --   --   --   --   INR  --   --  1.44  --   --   --   --   HEPARINUNFRC  --   --   --   --  0.40  --  0.27*  CREATININE 1.47* 1.90*  --  1.36*  --   --   --   CKTOTAL 37  --   --   --   --   --   --   CKMB 3.6  --   --   --   --   --   --   TROPONINI  --   --   --  0.47* 0.60* 0.54*  --     Estimated Creatinine Clearance: 40 ml/min (by C-G formula based on Cr of 1.36).   Medications:  Heparin @ 1000 units/hr (10 ml/hr)  Assessment: 79 y.o. F who continues on heparin for r/o ACS, r/o PE. Heparin level slightly subtherapeutic (0.27) on 1000 units/hr. No overt s/sx of bleeding noted at this time. Noted LE dopplers neg for DVT.  Goal of Therapy:  Heparin level 0.3-0.7 units/ml Monitor platelets by anticoagulation protocol: Yes   Plan:  1. Increase heparin to 1150 units/hr 2. Will f/u a.m. heparin level and CBC  Christoper Fabian, PharmD, BCPS Clinical pharmacist, pager [phone] 05/20/2013 7:33 PM

## 2013-05-20 NOTE — Progress Notes (Signed)
Patient transported to and from CT on Bipap with no apparent complications.

## 2013-05-20 NOTE — ED Notes (Addendum)
Patricia Roberson (daughter and POA) wishes to be called with updates. [phone]

## 2013-05-20 NOTE — Progress Notes (Signed)
ANTICOAGULATION CONSULT NOTE - Follow Up Consult  Pharmacy Consult for Heparin Indication: r/o ACS/PE  Allergies  Allergen Reactions  . Codeine Other (See Comments)    unknown  . Levaquin [Levofloxacin] Other (See Comments)    unknown  . Morphine Other (See Comments)    unknown    Patient Measurements: Height: 6' 3.98" (193 cm) Weight: 185 lb 3 oz (84 kg) IBW/kg (Calculated) : 82.26 Heparin Dosing Weight: 84 kg  Vital Signs: Temp: 98.9 F (37.2 C) (10/13 1221) Temp src: Oral (10/13 1221) BP: 103/50 mmHg (10/13 1200) Pulse Rate: 85 (10/13 1238)  Labs:  Recent Labs  05/20/13 0122 05/20/13 0139 05/20/13 0230 05/20/13 0650 05/20/13 1015  HGB 12.2  --   --  11.2*  --   HCT 37.2  --   --  34.5*  --   PLT 301  --   --  241  --   APTT  --   --  37  --   --   LABPROT  --   --  17.2*  --   --   INR  --   --  1.44  --   --   HEPARINUNFRC  --   --   --   --  0.40  CREATININE 1.47* 1.90*  --  1.36*  --   CKTOTAL 37  --   --   --   --   CKMB 3.6  --   --   --   --   TROPONINI  --   --   --  0.47* 0.60*    Estimated Creatinine Clearance: 40 ml/min (by C-G formula based on Cr of 1.36).   Medications:  Heparin @ 1000 units/hr (10 ml/hr)  Assessment: 79 y.o. F who continues on heparin for r/o ACS, r/o PE with a therapeutic heparin level this morning (HL 0.4, goal of 0.3-0.7). Baseline Hgb/Hct/Plt okay. No overt s/sx of bleeding noted at this time.   Goal of Therapy:  Heparin level 0.3-0.7 units/ml Monitor platelets by anticoagulation protocol: Yes   Plan:  1. Continue heparin at 1000 units/hr (10 ml/hr) 2. Will continue to monitor for any signs/symptoms of bleeding and will follow up with heparin level in 8 hours   Georgina Pillion, PharmD, BCPS Clinical Pharmacist Pager: [phone] 05/20/2013 1:35 PM

## 2013-05-21 ENCOUNTER — Inpatient Hospital Stay (HOSPITAL_COMMUNITY): Payer: Medicare Other

## 2013-05-21 DIAGNOSIS — F0391 Unspecified dementia with behavioral disturbance: Secondary | ICD-10-CM

## 2013-05-21 LAB — URINE CULTURE: Colony Count: >=100000

## 2013-05-21 LAB — BASIC METABOLIC PANEL
CO2: 27 mEq/L (ref 19–32)
Calcium: 8.6 mg/dL (ref 8.4–10.5)
Creatinine, Ser: 0.9 mg/dL (ref 0.50–1.10)
GFR calc Af Amer: 66 mL/min — ABNORMAL LOW (ref >90)
Glucose, Bld: 87 mg/dL (ref 70–99)
Sodium: 145 mEq/L (ref 135–145)

## 2013-05-21 LAB — CBC
MCHC: 31.6 g/dL (ref 30.0–36.0)
MCV: 99.4 fL (ref 78.0–100.0)
Platelets: 217 10*3/uL (ref 150–400)
RBC: 3.6 MIL/uL — ABNORMAL LOW (ref 3.87–5.11)
RDW: 18.8 % — ABNORMAL HIGH (ref 11.5–15.5)
WBC: 21.2 10*3/uL — ABNORMAL HIGH (ref 4.0–10.5)

## 2013-05-21 LAB — GLUCOSE, CAPILLARY
Glucose-Capillary: 87 mg/dL (ref 70–99)
Glucose-Capillary: 101 mg/dL — ABNORMAL HIGH (ref 70–99)

## 2013-05-21 LAB — HEPARIN LEVEL (UNFRACTIONATED): Heparin Unfractionated: 0.3 IU/mL (ref 0.30–0.70)

## 2013-05-21 MED ORDER — MEMANTINE HCL 10 MG PO TABS
10.0000 mg | ORAL_TABLET | Freq: Two times a day (BID) | ORAL | Status: DC
  Administered 2013-05-21 – 2013-05-24 (×7): 10 mg via ORAL
  Filled 2013-05-21 (×8): qty 1

## 2013-05-21 MED ORDER — BOOST / RESOURCE BREEZE PO LIQD
1.0000 | Freq: Two times a day (BID) | ORAL | Status: DC
  Administered 2013-05-21 – 2013-05-23 (×5): 1 via ORAL

## 2013-05-21 MED ORDER — HEPARIN SODIUM (PORCINE) 5000 UNIT/ML IJ SOLN
5000.0000 [IU] | Freq: Three times a day (TID) | INTRAMUSCULAR | Status: DC
  Administered 2013-05-21 – 2013-05-24 (×8): 5000 [IU] via SUBCUTANEOUS
  Filled 2013-05-21 (×12): qty 1

## 2013-05-21 MED ORDER — METOPROLOL TARTRATE 25 MG/10 ML ORAL SUSPENSION
6.2500 mg | Freq: Two times a day (BID) | ORAL | Status: DC
  Administered 2013-05-21 – 2013-05-24 (×7): 6.25 mg via ORAL
  Filled 2013-05-21 (×9): qty 2.5

## 2013-05-21 MED ORDER — FUROSEMIDE 10 MG/ML IJ SOLN
20.0000 mg | Freq: Once | INTRAMUSCULAR | Status: AC
  Administered 2013-05-21: 20 mg via INTRAVENOUS
  Filled 2013-05-21: qty 2

## 2013-05-21 MED ORDER — DONEPEZIL HCL 5 MG PO TABS
5.0000 mg | ORAL_TABLET | Freq: Every day | ORAL | Status: DC
  Administered 2013-05-21 – 2013-05-23 (×3): 5 mg via ORAL
  Filled 2013-05-21 (×4): qty 1

## 2013-05-21 MED ORDER — INFLUENZA VAC SPLIT QUAD 0.5 ML IM SUSP
0.5000 mL | INTRAMUSCULAR | Status: AC
  Administered 2013-05-22: 0.5 mL via INTRAMUSCULAR
  Filled 2013-05-21: qty 0.5

## 2013-05-21 MED ORDER — DULOXETINE HCL 60 MG PO CPEP
60.0000 mg | ORAL_CAPSULE | Freq: Every day | ORAL | Status: DC
  Administered 2013-05-21 – 2013-05-24 (×4): 60 mg via ORAL
  Filled 2013-05-21 (×4): qty 1

## 2013-05-21 MED ORDER — ATORVASTATIN CALCIUM 10 MG PO TABS
5.0000 mg | ORAL_TABLET | Freq: Every day | ORAL | Status: DC
  Administered 2013-05-21 – 2013-05-23 (×3): 5 mg via ORAL
  Filled 2013-05-21 (×4): qty 0.5

## 2013-05-21 MED ORDER — FUROSEMIDE 40 MG PO TABS
40.0000 mg | ORAL_TABLET | Freq: Every day | ORAL | Status: DC
  Filled 2013-05-21: qty 1

## 2013-05-21 MED ORDER — SPIRONOLACTONE 50 MG PO TABS
50.0000 mg | ORAL_TABLET | Freq: Every day | ORAL | Status: DC
  Administered 2013-05-21: 50 mg via ORAL
  Filled 2013-05-21 (×2): qty 1

## 2013-05-21 NOTE — Progress Notes (Signed)
PULMONARY  / CRITICAL CARE MEDICINE  Name: Patricia Roberson MRN: [id number] DOB: Aug 13, 1926    ADMISSION DATE:  05/20/2013  PRIMARY SERVICE: PCCM  CHIEF COMPLAINT:  Resp arrest  BRIEF PATIENT DESCRIPTION:  79 years old female with PMH relevant for diastolic CHF, HTN, COPD, dementia. Resident of a SNF. Brought by EMS after collapsing and received CPR for 6 minutes for questionable cardiac arrest. At arrival breathing spontaneously on face mask but hypoxic.   SIGNIFICANT EVENTS / STUDIES:  10/13 Admitted, on BiPAP, confused (baseline dementia); CT scan of the head 10/13 >> with no acute intracranial process; LE doppler NEGATIVE   LINES / TUBES: Peripheral IV's Foley 10/13 >>>  CULTURES: Blood cultures 10/13 >>>  Urine cultures 10/13 >>> >100k GRAM NEGATIVE RODS, report pending  ANTIBIOTICS: Zosyn 10/13 single dose (ED) Vancomycin 10/13 single dose (ED) Rocephin 10/13 >> Azithromycin 10/13 >>   SUBJECTIVE: Answers simple questions/follows simple commands on BiPAP. Denies pain. Feels weak.  VITAL SIGNS: Temp:  [97.9 F (36.6 C)-99.2 F (37.3 C)] 99.2 F (37.3 C) (10/14 0800) Pulse Rate:  [77-91] 85 (10/14 0700) Resp:  [20-30] 24 (10/14 0800) BP: (103-130)/(45-75) 125/61 mmHg (10/14 0800) SpO2:  [91 %-100 %] 100 % (10/14 0800) FiO2 (%):  [40 %-50 %] 40 % (10/14 0800) Weight:  [182 lb 8.7 oz (82.8 kg)] 182 lb 8.7 oz (82.8 kg) (10/14 0408) HEMODYNAMICS:   VENTILATOR SETTINGS: Vent Mode:  [-] BIPAP FiO2 (%):  [40 %-50 %] 40 % Set Rate:  [10 bmp] 10 bmp PEEP:  [6 cmH20] 6 cmH20 Pressure Support:  [6 cmH20] 6 cmH20 INTAKE / OUTPUT: Intake/Output     10/13 0701 - 10/14 0700 10/14 0701 - 10/15 0700   P.O. 4    I.V. (mL/kg) 752.5 (9.1) 32.5 (0.4)   IV Piggyback 300    Total Intake(mL/kg) 1056.5 (12.8) 32.5 (0.4)   Urine (mL/kg/hr) 1460 (0.7) 175 (1.4)   Total Output 1460 175   Net -403.5 -142.5          PHYSICAL EXAMINATION: General: Elderly patient on BiPAP, no  distress Heart: RRR, no murmur appreciated Lungs: Equal air movement bilaterally, some scattered coarse sounds but no focal abnormality Abdomen: Abdomen soft, non-tender and not distended, BS+ Musculoskeletal: No clubbing appreciated. Continued 1-2 + LE edema Skin: No rashes or lesions Neuro: No focal deficits, dementia at baseline, answers simple questions / follows simple commands  LABS:  CBC Recent Labs     05/20/13  0122  05/20/13  0650  05/21/13  0350  WBC  31.8*  26.4*  21.2*  HGB  12.2  11.2*  11.3*  HCT  37.2  34.5*  35.8*  PLT  301  241  217   Coag's Recent Labs     05/20/13  0230  APTT  37  INR  1.44   BMET Recent Labs     05/20/13  0122  05/20/13  0139  05/20/13  0650  05/21/13  0350  NA  136   --   137  145  K  4.1   --   3.5  3.8  CL  97   --   98  105  CO2  20   --   27  27  BUN  42*   --   41*  30*  CREATININE  1.47*  1.90*  1.36*  0.90  GLUCOSE  120*   --   127*  87   Electrolytes Recent Labs  05/20/13  0122  05/20/13  0155  05/20/13  0650  05/21/13  0350  CALCIUM  9.5   --   9.1  8.6  MG   --   2.1   --    --    Sepsis Markers No results found for this basename: LACTICACIDVEN, PROCALCITON, O2SATVEN,  in the last 72 hours ABG Recent Labs     05/20/13  0129  PHART  7.294*  PCO2ART  44.0  PO2ART  126.0*   Liver Enzymes Recent Labs     05/20/13  0122  AST  169*  ALT  207*  ALKPHOS  73  BILITOT  1.1  ALBUMIN  3.0*   Cardiac Enzymes Recent Labs     05/20/13  0122  05/20/13  0650  05/20/13  1015  05/20/13  1648  TROPONINI   --   0.47*  0.60*  0.54*  PROBNP  23984.0*   --    --    --    Glucose Recent Labs     05/20/13  0115  05/20/13  1142  05/20/13  1622  05/20/13  1945  05/20/13  2349  05/21/13  0359  GLUCAP  139*  111*  91  75  88  87    Imaging Ct Head Wo Contrast  05/20/2013   *RADIOLOGY REPORT*  Clinical Data: Loss of consciousness.  CT HEAD WITHOUT CONTRAST  Technique:  Contiguous axial images were  obtained from the base of the skull through the vertex without contrast.  Comparison: CT of head March 06, 2011  Findings: The ventricles and sulci are overall normal for age.  No intraparenchymal hemorrhage, mass effect nor midline shift. Remote bilateral basal ganglial lacunar infarcts, with mild ex vacuo dilatation of the left greater than right frontal horns.  Patchy supratentorial white matter hypodensities are within normal range for patient's age and though non-specific suggest sequalae of chronic small vessel ischemic disease. No acute large vascular territory infarcts.  No abnormal extra-axial fluid collections.  Basal cisterns are patent. Moderate calcific atherosclerosis of the carotid siphons and vertebral basilar system, similar.  No skull fracture.  Visualized paranasal sinuses and mastoid aircells are well-aerated.  The included ocular globes and orbital contents are non-suspicious.  IMPRESSION: No acute intracranial process.  Stable appearance of head:  Remote bilateral basal ganglia lacunar infarcts, mild to moderate white matter changes suggest chronic small vessel ischemic disease.   Original Report Authenticated By: Awilda Metro   Dg Chest Port 1 View  05/21/2013   CLINICAL DATA:  Shortness of breath. Infiltrates.  EXAM: PORTABLE CHEST - 1 VIEW  COMPARISON:  05/20/2013  FINDINGS: New indistinct opacity at the right base. Mild cardiomegaly, similar to prior. Unchanged upper mediastinal contours. Small bilateral pleural effusions. No overt edema. No pneumothorax.  IMPRESSION: 1. New opacity at the right base which could represent atelectasis, aspiration, or developing infection. 2. Small pleural effusions.   Electronically Signed   By: Tiburcio Pea M.D.   On: 05/21/2013 06:32   Dg Chest Portable 1 View  05/20/2013   *RADIOLOGY REPORT*  Clinical Data: Loss of consciousness, respiratory distress.  PORTABLE CHEST - 1 VIEW  Comparison: Chest radiograph March 05, 2011  Findings: Cardiac  silhouette appears moderately enlarged, similar. Mildly calcified aortic knob.  Mediastinal silhouette is not suspicious.  Mild chronic interstitial changes similar with blunting of the left costophrenic angle, unchanged.  Central pulmonary vascular engorgement.  No pneumothorax though, lung apices obscured by facial structures.  Multiple EKG  lines overlay the patient and could obscure underlying subtle pathology.  Soft tissue planes and included osseous structures are not suspicious.  IMPRESSION: Stable cardiomegaly and central pulmonary vasculature congestion. Mild chronic interstitial changes, blunting of the left costophrenic angle favors pleural thickening as this is similar to prior examination.   Original Report Authenticated By: Awilda Metro   CXR: 10/14 - increased RLL opacity, ?atelectasis vs pneumonia, small bilat effusions  ASSESSMENT / PLAN:  PULMONARY A: Acute hypoxemic respiratory failure, sudden onset, unclear etiology (? Infection, PE, primary cardiac event, flash pulmonary edema). Also possible pneumonia with more distinct opacity on CXR 10/14. Less likely PE - Not a CTA candidate (renal failure). No V/Q scan 10/13 due to weakness. LE doppler negative 10/13 P:   - Continue BiPAP 12/6, FiO2 50% prn, attempt to wean to off 10/14 - Empiric heparin drip >>> stop with negative LE doppler - home Advair and Spiriva - Albuterol PRN  CARDIOVASCULAR A:  1) Questionable cardiac arrest with return to spontaneous circulation after 6 minutes of CPR prior to arrival 2) Elevated troponin with non specific EKG changes post arrest - not rising on trend 10/13 3) Diastolic congestive heart failure with markedly elevated BNP. P:  - restart her home lasix - consider recheck troponin for trend - stop heparin drip as above - holding home PO Lasix, Aldactone, Lipitor, metoprolol - restart 10/14 - monitor fluid balance carefully  RENAL A:   1) Acute renal failure, likely pre renal, peak S  Cr 1.9 - improved 10/14 2) Urinary tract infection - on Rocephin, see ID below P:   - D5-1/2 at [facility] - see Endocrine below - trend BMP  GASTROINTESTINAL A:  No acute issues P: - consider restart PO meds / diet if able to wean off BiPAP - Protonix  HEMATOLOGIC A:  Leukocytosis - improving, setting of UTI and ?PNA P:  - trend CBC  INFECTIOUS A:   Urinary tract infection. Possible PNA with incr opacity RLL on CXR 10/14 P:   - Rocephin f/u cultures  ENDOCRINE A:  Hypoglycemia overnight 10/13 while NPO. No history of diabetes P:  - D5-1/2 at [facility]  NEUROLOGIC A:  Baseline dementia +/- altered mental status in acute infection(s) P:   - hold home medications while NPO (Aricept, Namenda, Cymbalta) - consider restart home meds today if able to wean BiPAP  Please see also pending addendum / revision / finalization by attending Dr. Delton Coombes.  Bobbye Morton, MD PGY-2, [facility] 05/21/2013, 8:45 AM   Levy Pupa, MD, PhD 05/21/2013, 11:04 AM [facility] [phone] or if no answer [phone]

## 2013-05-21 NOTE — Clinical Social Work Note (Signed)
Clinical Social Work Department BRIEF PSYCHOSOCIAL ASSESSMENT 05/21/2013  Patient:  Patricia Roberson, Patricia Roberson     Account Number:  [id number]     Admit date:  05/20/2013  Clinical Social Worker:  Read Drivers  Date/Time:  05/21/2013 11:51 AM  Referred by:  Physician  Date Referred:  05/21/2013 Referred for  SNF Placement   Other Referral:   none   Interview type:  Patient Other interview type:   none    PSYCHOSOCIAL DATA Living Status:  FACILITY Admitted from facility:  [facility] Level of care:  Skilled Nursing Facility Primary support name:  Daughter, Payton Doughty Primary support relationship to patient:  CHILD, ADULT Degree of support available:   adequate  pt also has step son who lives in [place]    CURRENT CONCERNS Current Concerns  Post-Acute Placement   Other Concerns:   none    SOCIAL WORK ASSESSMENT / PLAN CSW assessed pt at bedside.  CSW introduced self and CSW role.  Pt was alert and oriented to self and place.  Pt not oriented to day/time.  Pt reports her daughter being her HCPOA who is Payton Doughty who lives in [place].  Pt confirms that she is a long-term resident at [facility] and anticipates returning upon d/c.  CSW spoke to Jodie Echevaria at [facility], [place] who confirmed that the pt was a resident and was eligible to return upon her dc from [facility].  CSW will continue to follow.   Assessment/plan status:  Psychosocial Support/Ongoing Assessment of Needs Other assessment/ plan:   none   Information/referral to community resources:   SNF    PATIENT'S/FAMILY'S RESPONSE TO PLAN OF CARE: Pt was appreciative of CSW assistance.       Vickii Penna, LCSWA [phone]  Clinical Social Work

## 2013-05-21 NOTE — Evaluation (Signed)
Physical Therapy Evaluation Patient Details Name: Patricia Roberson MRN: [id number] DOB: Jan 01, 1927 Today's Date: 05/21/2013 Time: 1191-4782 PT Time Calculation (min): 19 min  PT Assessment / Plan / Recommendation History of Present Illness  79 y.o. female admitted to [facility] on 05/20/13 syncope, questionable cardiac arrest with return to spontaneous circulation after 6 minutes. Respiratory failure.  Clinical Impression  The pt is very dyspnic with mobility to the chair and back.  RR increased to 42/min and HR increased to 112 bpm.  O2 sats on 4 L O2 [place] were 90%.  It took pt several minutes to recover from just a little bit of mobility (~4 mins with therarpist demonstrating slow, pursed lip breathing technique).  The pt is appropriate to return to SNF memory care unit at discharge.  She would benefit from therapy f/u there.   PT to follow acutely for deficits listed below.       PT Assessment  Patient needs continued PT services    Follow Up Recommendations  SNF    Does the patient have the potential to tolerate intense rehabilitation     NA  Barriers to Discharge   None      Equipment Recommendations  None recommended by PT    Recommendations for Other Services   None  Frequency Min 2X/week    Precautions / Restrictions Precautions Precautions: Fall Precaution Comments: monitor O2 sats and RR.     Pertinent Vitals/Pain  RR increased to 42/min and HR increased to 112 bpm.  O2 sats on 4 L O2 [place] were 90%.      Mobility  Bed Mobility Bed Mobility: Supine to Sit;Sitting - Scoot to [place] of Bed;Sit to Supine Supine to Sit: 3: Mod assist;With rails;HOB elevated Sitting - Scoot to Edge of Bed: 3: Mod assist;With rail Sit to Supine: 3: Mod assist;With rail;HOB flat Details for Bed Mobility Assistance: pt needing support at trunk and assist with bil legs.  She has decreased initiation with verbal cues and responds well to tactile cues for requested task.   Transfers Transfers: Sit to  Stand;Stand to [employer] Transfers Sit to Stand: 3: Mod assist;With upper extremity assist;With armrests;From bed;From chair/3-in-1 Stand to Sit: 4: Min assist;With upper extremity assist;With armrests;To bed;To chair/3-in-1 Stand Pivot Transfers: 3: Mod assist;From elevated surface;With armrests Details for Transfer Assistance: stood and transfered x 2 into and out of chair to bed.  Pt needs support for balance during transition to stand especially from lower chair surface, but did so well.  She took 2-3 pivotal steps with both transfers and needed upper extremtiy support for balance while stepping.  Pt tends to lean posteriorly in standing.   Ambulation/Gait Ambulation/Gait Assistance: Not tested (comment) (per chart she was non-ambulatory PTA)        PT Diagnosis: Difficulty walking;Abnormality of gait;Generalized weakness;Altered mental status  PT Problem List: Decreased strength;Decreased activity tolerance;Decreased balance;Decreased mobility;Decreased cognition;Decreased knowledge of use of DME;Decreased safety awareness;Cardiopulmonary status limiting activity PT Treatment Interventions: DME instruction;Gait training;Functional mobility training;Therapeutic activities;Therapeutic exercise;Balance training;Neuromuscular re-education;Cognitive remediation;Patient/family education;Wheelchair mobility training     PT Goals(Current goals can be found in the care plan section) Acute Rehab PT Goals Patient Stated Goal: none stated PT Goal Formulation: Patient unable to participate in goal setting Time For Goal Achievement: 06/04/13 Potential to Achieve Goals: Good  Visit Information  Last PT Received On: 05/21/13 Assistance Needed: +1 History of Present Illness: 79 y.o. female admitted to [facility] on 05/20/13 syncope, questionable cardiac arrest with return to spontaneous circulation after  6 minutes. Respiratory failure.       Prior Functioning  Home Living Family/patient expects to  be discharged to:: Skilled nursing facility [facility]) Additional Comments: per chart she was standing and pivoting into and out of bed with assist to a WC PTA.   Prior Function Level of Independence: Needs assistance Gait / Transfers Assistance Needed: assist needed for stand pivot transfer ADL's / Homemaking Assistance Needed: total assist for housemaking, no one to report baseline for ADLs, but since she was from SNF assist was likely needed.   Communication / Swallowing Assistance Needed: no difficulty observed or reported Communication Communication: No difficulties    Cognition  Cognition Arousal/Alertness: Awake/alert Behavior During Therapy: WFL for tasks assessed/performed Overall Cognitive Status: History of cognitive impairments - at baseline    Extremity/Trunk Assessment Upper Extremity Assessment Upper Extremity Assessment: Generalized weakness Lower Extremity Assessment Lower Extremity Assessment: Generalized weakness Cervical / Trunk Assessment Cervical / Trunk Assessment: Normal   Balance Balance Balance Assessed: Yes Static Sitting Balance Static Sitting - Balance Support: Bilateral upper extremity supported;Feet supported Static Sitting - Level of Assistance: 5: Stand by assistance Static Standing Balance Static Standing - Balance Support: Bilateral upper extremity supported Static Standing - Level of Assistance: 4: Min assist;3: Mod assist Dynamic Standing Balance Dynamic Standing - Balance Support: Bilateral upper extremity supported Dynamic Standing - Level of Assistance: 4: Min assist;3: Mod assist  End of Session PT - End of Session Equipment Utilized During Treatment: Oxygen Activity Tolerance: Patient limited by fatigue;Other (comment) (limited by cardiopulmonary status) Patient left: in bed;with call bell/phone within reach;with bed alarm set Nurse Communication: Mobility status    Lurena Joiner B. Baylie Drakes, PT, DPT [phone]   05/21/2013, 3:50  PM

## 2013-05-21 NOTE — Progress Notes (Signed)
**Note De-Identified  Obfuscation** Rt note: patient removed from BIPAP for a trial; currently patient tolerating 4L  [place] SAT 99%

## 2013-05-21 NOTE — Progress Notes (Signed)
ANTICOAGULATION CONSULT NOTE - Follow Up Consult  Pharmacy Consult for heparin Indication: chest pain/ACS  Labs:  Recent Labs  05/20/13 0122 05/20/13 0139 05/20/13 0230 05/20/13 0650 05/20/13 1015 05/20/13 1648 05/20/13 1813 05/21/13 0350  HGB 12.2  --   --  11.2*  --   --   --  11.3*  HCT 37.2  --   --  34.5*  --   --   --  35.8*  PLT 301  --   --  241  --   --   --  217  APTT  --   --  37  --   --   --   --   --   LABPROT  --   --  17.2*  --   --   --   --   --   INR  --   --  1.44  --   --   --   --   --   HEPARINUNFRC  --   --   --   --  0.40  --  0.27* 0.30  CREATININE 1.47* 1.90*  --  1.36*  --   --   --   --   CKTOTAL 37  --   --   --   --   --   --   --   CKMB 3.6  --   --   --   --   --   --   --   TROPONINI  --   --   --  0.47* 0.60* 0.54*  --   --     Assessment: 79yo female now therapeutic on heparin after rate increase though at very low end of goal.  Goal of Therapy:  Heparin level 0.3-0.7 units/ml   Plan:  Will increase heparin gtt by another 1 unit/kg/hr to 1250 units/hr and check level in 8hr.  Vernard Gambles, PharmD, BCPS  05/21/2013,4:54 AM

## 2013-05-22 ENCOUNTER — Inpatient Hospital Stay (HOSPITAL_COMMUNITY): Payer: Medicare Other

## 2013-05-22 DIAGNOSIS — J9601 Acute respiratory failure with hypoxia: Secondary | ICD-10-CM | POA: Diagnosis present

## 2013-05-22 DIAGNOSIS — E860 Dehydration: Secondary | ICD-10-CM | POA: Diagnosis present

## 2013-05-22 DIAGNOSIS — N390 Urinary tract infection, site not specified: Secondary | ICD-10-CM | POA: Diagnosis present

## 2013-05-22 DIAGNOSIS — N179 Acute kidney failure, unspecified: Secondary | ICD-10-CM | POA: Diagnosis present

## 2013-05-22 DIAGNOSIS — E876 Hypokalemia: Secondary | ICD-10-CM | POA: Diagnosis present

## 2013-05-22 LAB — PROTIME-INR: INR: 1.32 (ref 0.00–1.49)

## 2013-05-22 LAB — COMPREHENSIVE METABOLIC PANEL
ALT: 121 U/L — ABNORMAL HIGH (ref 0–35)
AST: 42 U/L — ABNORMAL HIGH (ref 0–37)
Albumin: 2.6 g/dL — ABNORMAL LOW (ref 3.5–5.2)
Alkaline Phosphatase: 72 U/L (ref 39–117)
CO2: 29 mEq/L (ref 19–32)
Chloride: 102 mEq/L (ref 96–112)
Creatinine, Ser: 0.72 mg/dL (ref 0.50–1.10)
GFR calc non Af Amer: 77 mL/min — ABNORMAL LOW (ref >90)
Potassium: 3.3 mEq/L — ABNORMAL LOW (ref 3.5–5.1)
Sodium: 141 mEq/L (ref 135–145)
Total Bilirubin: 0.8 mg/dL (ref 0.3–1.2)

## 2013-05-22 MED ORDER — ALBUTEROL SULFATE (5 MG/ML) 0.5% IN NEBU
2.5000 mg | INHALATION_SOLUTION | RESPIRATORY_TRACT | Status: DC | PRN
  Administered 2013-05-23: 13:00:00 2.5 mg via RESPIRATORY_TRACT
  Filled 2013-05-22: qty 0.5

## 2013-05-22 MED ORDER — POLYETHYLENE GLYCOL 3350 17 G PO PACK
17.0000 g | PACK | Freq: Two times a day (BID) | ORAL | Status: DC
  Administered 2013-05-23 – 2013-05-24 (×3): 17 g via ORAL
  Filled 2013-05-22 (×5): qty 1

## 2013-05-22 MED ORDER — PANTOPRAZOLE SODIUM 40 MG PO TBEC
40.0000 mg | DELAYED_RELEASE_TABLET | Freq: Every day | ORAL | Status: DC
  Administered 2013-05-23 – 2013-05-24 (×2): 40 mg via ORAL
  Filled 2013-05-22 (×2): qty 1

## 2013-05-22 MED ORDER — POTASSIUM CHLORIDE CRYS ER 20 MEQ PO TBCR
40.0000 meq | EXTENDED_RELEASE_TABLET | Freq: Once | ORAL | Status: AC
  Administered 2013-05-22: 40 meq via ORAL
  Filled 2013-05-22: qty 2

## 2013-05-22 MED ORDER — METRONIDAZOLE IN NACL 5-0.79 MG/ML-% IV SOLN
500.0000 mg | Freq: Three times a day (TID) | INTRAVENOUS | Status: DC
  Administered 2013-05-22 – 2013-05-23 (×3): 500 mg via INTRAVENOUS
  Filled 2013-05-22 (×4): qty 100

## 2013-05-22 MED ORDER — HYDRALAZINE HCL 10 MG PO TABS
10.0000 mg | ORAL_TABLET | Freq: Three times a day (TID) | ORAL | Status: DC
  Administered 2013-05-22 – 2013-05-24 (×7): 10 mg via ORAL
  Filled 2013-05-22 (×11): qty 1

## 2013-05-22 NOTE — Progress Notes (Addendum)
[facility] Progress Note [place] TEAM 1 - Stepdown ICU Team   Patricia Roberson WUJ:[id number] DOB: 1927/02/27 DOA: 05/20/2013 PCP: No PCP Per Patient  Brief narrative: 79 year old SNF resident who was sent via EMS to the ER after an episode of syncope vs cardiac arrest. ER physician obtained hx from EMS. They were told by facility staff that the patient had a BM on commode and then stood up and collapsed. Staff member, reportedly, could not detect a pulse and CPR was performed for approximately 99m before paramedics arrived. Upon their arrival to the SNF, the patient was found to have good peripheral pulses but appeared tachypneic and had RA sats of 83%. She was placed on 15L O2 via NRB and transported to the ED. Her CBG was normal. Her 12 lead via EMS showed sinus rhythm without signs of STEMI. There was no family at bedside upon ER physician evaluation. Paperwork from the SNF indicated that the patient was a FULL CODE. BiPAP was initiated with improvement in sats. Due to concerns of ?? CHF exacerbation she was given Lasix IV x 1 dose. She was subsequently admitted to the ICU by PCCM. CT head was unremarkable. Due to concerns of initial syncopal event 2/2 PE LE duplex completed but was negative. UA was positive for GNR UTI. She eventually returned to apparent baseline mentation and was successfully weaned to [place] oxygen. She transferred to SDU 05/21/13.   Assessment/Plan:  Acute respiratory failure with hypoxia due to:   A) Right lower lobe pneumonia vs/ pneumonitis -initially hypoxic due to altered mentation -recent CXR with RLL opacity- suspect possible aspiration -add Flagyl - SLP eval    B) COPD (chronic obstructive pulmonary disease) -compensated w/o wheezing     C) Severe pulmonary HTN/RHF -has associated mod/severe TR with RV dilatation -Patient has prior extensive history of tobacco abuse -needs good pre load given RV dilatation with severely reduced right systolic function and DH  could precipitate syncope/orthostasis - should avoid diuretics in future if able  -initiate hydralazine tx and follow      Acute renal failure on CKD (chronic kidney disease), stage III -primarily 2/2 DH (was on Lasix and aldactone pre admit) -improving -cont to hold diuretics  ANEMIA-IRON DEFICIENCY -hgb stable - colonoscopy not indicated due to advanced age   Unspecified essential hypertension -BP soft so will hold diuretics but will cont usual low dose BB  Klebsiella UTI (lower urinary tract infection) -cont Rocephin -blood cx's negative   Hypokalemia -replete and follow lytes  Dementia with behavioral disturbance -PT/OT eval - pt lives in a SNF already   PVD (peripheral vascular disease)  Dehydration -IVF dc'd- follow PO intake o/w as above  DVT prophylaxis: Subcutaneous heparin Code Status: Full Family Communication: Discussed plan of care at length with daughter/POA at bedside Disposition Plan/Expected LOS: Transfer to telemetry - follow blood pressure with titration of cardiac medications - continue IV antibiotics - advised POA return to SNF ~Monday next week likely   Consultants: None  Procedures: TTE - Left ventricle: Wall thickness was increased in a pattern of mild LVH. Systolic function was normal. The estimated ejection fraction was in the range of 55% to 65%. Wall motion was normal; there were no regional wall motion abnormalities. - Ventricular septum: Septal motion showed paradox. The contour showed systolic flattening. - Aortic valve: Trivial regurgitation. - Mitral valve: Calcified annulus. Mild regurgitation. - Right ventricle: The cavity size was moderately dilated. Systolic function was moderately to severely reduced. - Right  atrium: The atrium was dilated. - Atrial septum: No defect or patent foramen ovale was identified. - Tricuspid valve: Moderate-severe regurgitation. - Pulmonary arteries: Systolic pressure was severely increased. PA  peak pressure: 95mm Hg (S).  Antibiotics: Azithromycin x 1 Rocephin 10/11 >>>  Flagyl 10/15 >>> Zosyn x 1 Vancomycin x 1  HPI/Subjective: Awake with flat affect. Knows she is not at her usual place of residence but otherwise very confused- unable to remember town she was born in. No c/o's CP, SOB or abdominal pain  Objective: Blood pressure 120/58, pulse 98, temperature 98.7 F (37.1 C), temperature source Oral, resp. rate 32, height 6' 3.98" (1.93 m), weight 82.8 kg (182 lb 8.7 oz), SpO2 93.00%.  Intake/Output Summary (Last 24 hours) at 05/22/13 1024 Last data filed at 05/22/13 0800  Gross per 24 hour  Intake   1085 ml  Output    415 ml  Net    670 ml    Exam: General: No acute respiratory distress Lungs: Clear to auscultation bilaterally without wheezes or crackles, 5 L Cardiovascular: Regular rate and rhythm without murmur gallop or rub normal S1 and S2, no peripheral edema  Abdomen: Nontender, nondistended, soft, bowel sounds positive, no rebound, no ascites, no appreciable mass Musculoskeletal: No significant cyanosis, clubbing of bilateral lower extremities Neurological: Awake and oriented x name, flat affect, moves all extremities x 4 without focal neurological deficits, CN 2-12 intact  Scheduled Meds:  Scheduled Meds: . atorvastatin  5 mg Oral q1800  . cefTRIAXone (ROCEPHIN)  IV  1 g Intravenous Q24H  . donepezil  5 mg Oral QHS  . DULoxetine  60 mg Oral Daily  . feeding supplement (RESOURCE BREEZE)  1 Container Oral BID BM  . heparin subcutaneous  5,000 Units Subcutaneous Q8H  . memantine  10 mg Oral BID  . metoprolol tartrate  6.25 mg Oral BID  . metronidazole  500 mg Intravenous Q8H  . mometasone-formoterol  2 puff Inhalation BID  . pantoprazole (PROTONIX) IV  40 mg Intravenous Q24H  . tiotropium  18 mcg Inhalation Daily   Data Reviewed: Basic Metabolic Panel:  Recent Labs Lab 05/20/13 0122 05/20/13 0139 05/20/13 0155 05/20/13 0650 05/21/13 0350  05/22/13 0521  NA 136  --   --  137 145 141  K 4.1  --   --  3.5 3.8 3.3*  CL 97  --   --  98 105 102  CO2 20  --   --  27 27 29   GLUCOSE 120*  --   --  127* 87 113*  BUN 42*  --   --  41* 30* 19  CREATININE 1.47* 1.90*  --  1.36* 0.90 0.72  CALCIUM 9.5  --   --  9.1 8.6 8.8  MG  --   --  2.1  --   --   --    Liver Function Tests:  Recent Labs Lab 05/20/13 0122 05/22/13 0521  AST 169* 42*  ALT 207* 121*  ALKPHOS 73 72  BILITOT 1.1 0.8  PROT 6.9 6.3  ALBUMIN 3.0* 2.6*    Recent Labs Lab 05/20/13 0122  LIPASE 23   CBC:  Recent Labs Lab 05/20/13 0122 05/20/13 0650 05/21/13 0350  WBC 31.8* 26.4* 21.2*  NEUTROABS 28.9*  --   --   HGB 12.2 11.2* 11.3*  HCT 37.2 34.5* 35.8*  MCV 98.9 98.3 99.4  PLT 301 241 217   Cardiac Enzymes:  Recent Labs Lab 05/20/13 0122 05/20/13 0650 05/20/13 1015 05/20/13  1648  CKTOTAL 37  --   --   --   CKMB 3.6  --   --   --   TROPONINI  --  0.47* 0.60* 0.54*   BNP (last 3 results)  Recent Labs  05/20/13 0122  PROBNP 23984.0*   CBG:  Recent Labs Lab 05/20/13 1945 05/20/13 2349 05/21/13 0359 05/21/13 0837 05/21/13 1140  GLUCAP 75 88 87 101* 93    Recent Results (from the past 240 hour(s))  CULTURE, BLOOD (ROUTINE X 2)     Status: None   Collection Time    05/20/13  1:30 AM      Result Value Range Status   Specimen Description BLOOD LEFT ARM   Final   Special Requests BOTTLES DRAWN AEROBIC ONLY 3.5CC   Final   Culture  Setup Time     Final   Value: 05/20/2013 09:13     Performed at [facility]   Culture     Final   Value:        BLOOD CULTURE RECEIVED NO GROWTH TO DATE CULTURE WILL BE HELD FOR 5 DAYS BEFORE ISSUING A FINAL NEGATIVE REPORT     Performed at [facility]   Report Status PENDING   Incomplete  CULTURE, BLOOD (ROUTINE X 2)     Status: None   Collection Time    05/20/13  1:55 AM      Result Value Range Status   Specimen Description BLOOD RIGHT HAND   Final   Special Requests  BOTTLES DRAWN AEROBIC AND ANAEROBIC 10CC   Final   Culture  Setup Time     Final   Value: 05/20/2013 09:13     Performed at [facility]   Culture     Final   Value:        BLOOD CULTURE RECEIVED NO GROWTH TO DATE CULTURE WILL BE HELD FOR 5 DAYS BEFORE ISSUING A FINAL NEGATIVE REPORT     Performed at [facility]   Report Status PENDING   Incomplete  URINE CULTURE     Status: None   Collection Time    05/20/13  2:29 AM      Result Value Range Status   Specimen Description URINE, CATHETERIZED   Final   Special Requests ADDED 0302   Final   Culture  Setup Time     Final   Value: 05/20/2013 03:42     Performed at [facility] Count     Final   Value: >=100,000 COLONIES/ML     Performed at [facility]   Culture     Final   Value: KLEBSIELLA PNEUMONIAE     Performed at [facility]   Report Status 05/21/2013 FINAL   Final   Organism ID, Bacteria KLEBSIELLA PNEUMONIAE   Final  MRSA PCR SCREENING     Status: None   Collection Time    05/20/13  5:35 AM      Result Value Range Status   MRSA by PCR NEGATIVE  NEGATIVE Final   Comment:            The GeneXpert MRSA Assay (FDA     approved for NASAL specimens     only), is one component of a     comprehensive MRSA colonization     surveillance program. It is not     intended to diagnose MRSA     infection nor to guide or  monitor treatment for     MRSA infections.     Studies:  Recent x-ray studies have been reviewed in detail by the Attending Physician     Junious Silk, ANP [facility] Office  [phone] Pager [phone]  **If unable to reach the above provider after paging please contact the Flow Manager @ [phone]  On-Call/Text Page:      [email or web address]      password [email or web address]  If 7PM-7AM, please contact night-coverage [email or web address] Password [email or web address] 05/22/2013, 10:24 AM   LOS: 2 days   I have personally examined this patient and reviewed the entire database. I  have reviewed the above note, made any necessary editorial changes, and agree with its content.  Lonia Blood, MD [facility]

## 2013-05-22 NOTE — Evaluation (Signed)
Occupational Therapy Evaluation Patient Details Name: Patricia Roberson MRN: [id number] DOB: August 21, 1926 Today's Date: 05/22/2013 Time: 4098-1191 OT Time Calculation (min): 18 min  OT Assessment / Plan / Recommendation History of present illness 79 y.o. female admitted to [facility] on 05/20/13 syncope, questionable cardiac arrest with return to spontaneous circulation after 6 minutes. Respiratory failure.   Clinical Impression   Pt admitted with above.  She presents to OT with the below listed deficits.  She has h/o dementia and resides at [facility].  Pt. With 02 sats 87% on 5L with simple mobility (moving to EOB and transferring to chair); RR 35.  Sats improved to 91% on 5L after ~4 mins rest with RR 28-31.  She will benefit from OT to maximize safety and independence with BADLs.  Recommend return to SNF when medically stable.     OT Assessment  Patient needs continued OT Services    Follow Up Recommendations       Barriers to Discharge      Equipment Recommendations  None recommended by OT    Recommendations for Other Services    Frequency  Min 2X/week    Precautions / Restrictions Precautions Precautions: Fall Precaution Comments: monitor O2 sats and RR.     Pertinent Vitals/Pain     ADL  Eating/Feeding: Set up Where Assessed - Eating/Feeding: Chair Grooming: Brushing hair;Supervision/safety Where Assessed - Grooming: Supported sitting Upper Body Bathing: Maximal assistance Where Assessed - Upper Body Bathing: Supported sitting Lower Body Bathing: +1 Total assistance Where Assessed - Lower Body Bathing: Supported sit to stand Upper Body Dressing: Maximal assistance Where Assessed - Upper Body Dressing: Unsupported sitting Lower Body Dressing: +1 Total assistance Where Assessed - Lower Body Dressing: Supported sit to Pharmacist, hospital: Minimal assistance Toilet Transfer Method: Sit to stand;Stand pivot Acupuncturist: Bedside  commode Toileting - Clothing Manipulation and Hygiene: +1 Total assistance Where Assessed - Toileting Clothing Manipulation and Hygiene: Standing Transfers/Ambulation Related to ADLs: Min A for stand pivot transfer.   ADL Comments: Pt only able to tolerate simple grooming activities    OT Diagnosis: Generalized weakness;Cognitive deficits  OT Problem List: Decreased strength;Decreased activity tolerance;Impaired balance (sitting and/or standing);Decreased cognition;Decreased safety awareness;Decreased knowledge of use of DME or AE;Cardiopulmonary status limiting activity OT Treatment Interventions: Self-care/ADL training;DME and/or AE instruction;Therapeutic activities;Cognitive remediation/compensation;Patient/family education;Balance training   OT Goals(Current goals can be found in the care plan section) Acute Rehab OT Goals Patient Stated Goal: none stated OT Goal Formulation: With patient Time For Goal Achievement: 05/29/13 Potential to Achieve Goals: Good  Visit Information  Last OT Received On: 05/22/13 Assistance Needed: +1 History of Present Illness: 79 y.o. female admitted to [facility] on 05/20/13 syncope, questionable cardiac arrest with return to spontaneous circulation after 6 minutes. Respiratory failure.       Prior Functioning     Home Living Family/patient expects to be discharged to:: Skilled nursing facility [facility]) Additional Comments: per chart she was standing and pivoting into and out of bed with assist to a WC PTA.   Prior Function Level of Independence: Needs assistance Gait / Transfers Assistance Needed: assist needed for stand pivot transfer ADL's / Homemaking Assistance Needed: total assist for housemaking, no one to report baseline for ADLs, but since she was from SNF assist was likely needed.   Communication / Swallowing Assistance Needed: no difficulty observed or reported Communication Communication: No difficulties          Vision/Perception  Cognition  Cognition Arousal/Alertness: Awake/alert Behavior During Therapy: WFL for tasks assessed/performed Overall Cognitive Status: History of cognitive impairments - at baseline    Extremity/Trunk Assessment Upper Extremity Assessment Upper Extremity Assessment: Generalized weakness Lower Extremity Assessment Lower Extremity Assessment: Generalized weakness     Mobility Bed Mobility Bed Mobility: Supine to Sit;Sitting - Scoot to Edge of Bed Supine to Sit: 3: Mod assist;HOB elevated;With rails Sitting - Scoot to Edge of Bed: 2: Max assist Details for Bed Mobility Assistance: Pt needing step by step instruction.  Assist to lift trunk and to move LEs.  Pt unable to scoot hips to EOB without max A Transfers Transfers: Sit to Stand;Stand to Sit Sit to Stand: 4: Min assist;With upper extremity assist;From bed Stand to Sit: 4: Min assist;With upper extremity assist;To chair/3-in-1 Details for Transfer Assistance: Pt requires assist to move into standing.  Pt. fatigued quickly with 02 sat 87% but increased to 91% after ~4 min rest and deep breathing.      Exercise     Balance Balance Balance Assessed: Yes Static Sitting Balance Static Sitting - Balance Support: Bilateral upper extremity supported;Feet supported Static Sitting - Level of Assistance: 5: Stand by assistance Static Sitting - Comment/# of Minutes: 2 mins Static Standing Balance Static Standing - Balance Support: Right upper extremity supported Static Standing - Level of Assistance: 4: Min assist   End of Session OT - End of Session Equipment Utilized During Treatment: Oxygen (5L) Activity Tolerance: Patient limited by fatigue Patient left: in chair;with call bell/phone within reach;with family/visitor present Nurse Communication: Mobility status  GO     Addaline Peplinski M 05/22/2013, 10:50 AM

## 2013-05-22 NOTE — Procedures (Signed)
Objective Swallowing Evaluation: Modified Barium Swallowing Study  Patient Details  Name: Patricia Roberson MRN: [id number] Date of Birth: 02/14/1927  Today's Date: 05/22/2013 Time: 1350-1430 SLP Time Calculation (min): 40 min  Past Medical History:  Past Medical History  Diagnosis Date  . Depression   . Hypertension   . CHF (congestive heart failure)   . Alzheimer's disease   . Edema   . Dementia in conditions classified elsewhere with behavioral disturbance(294.11)    Past Surgical History: History reviewed. No pertinent past surgical history. HPI:  79 y.o. female admitted to [facility] on 05/20/13 syncope, questionable cardiac arrest with return to spontaneous circulation after 6 minutes. Respiratory failure due to RLL PNA (? aspiration). Note that infiltrate was noted on f/u, not on original admission CXR.  On clinical swallow eval this am, pt presented with relatively functional swallow, but given hx of recurrent PNAs, acute RLL PNA, h/o COPD, and h/o dementia, instrumental swallow study was recommended.     Assessment / Plan / Recommendation Clinical Impression  Dysphagia Diagnosis: Within Functional Limits  Clinical impression: Pt's swallow function was [facility] with noted occasional, high penetration of thin liquids into larynx.  Thins were ejected from larynx upon completion of swallow response.  Propulsion of material and passage through pharynx was normal.  Esophageal screen revealed barium stasis in distal esophagus with noted backflow.    Pt was observed with lunch meal after study.  Functionally, there were concerns not evident during MBS when pt was breathing comfortably.   RR was in mid 30s and Sp02 fluctuated between 88-90, creating greater potential for aspiration due to compromised timing of swallow-respiratory cycle.   Pt required reminders to slow rate and allow rest breaks.  No overt coughing noted, however.   Recommend continuing with regular consistency diet and thin liquids, meds  whole with liquid.   Will f/u briefly for pt/family education re: respiration and swallowing safety.         Treatment Recommendation  Therapy as outlined in treatment plan below    Diet Recommendation Regular;Thin liquid   Liquid Administration via: Cup;Straw Medication Administration: Whole meds with liquid Supervision: Patient able to self feed;Intermittent supervision to cue for compensatory strategies Compensations: Slow rate;Small sips/bites (take breaks when SOB or RR exceeds 30) Postural Changes and/or Swallow Maneuvers: Seated upright 90 degrees    Other  Recommendations Oral Care Recommendations: Oral care BID   Follow Up Recommendations  None    Frequency and Duration min 1 x/week  1 week   Pertinent Vitals/Pain No pain     General Date of Onset: 05/20/13 Type of Study: Modified Barium Swallowing Study Reason for Referral: Objectively evaluate swallowing function Previous Swallow Assessment:  (clinical swallow eval 05/22/13) Diet Prior to this Study: Dysphagia 3 (soft);Thin liquids Temperature Spikes Noted: No Respiratory Status: Nasal cannula History of Recent Intubation: No Behavior/Cognition: Alert;Cooperative;Pleasant mood Oral Cavity - Dentition: Adequate natural dentition Oral Motor / Sensory Function: Within functional limits Self-Feeding Abilities: Able to feed self Patient Positioning: Upright in bed Baseline Vocal Quality: Clear Volitional Cough: Weak Volitional Swallow: Able to elicit Anatomy: Within functional limits Pharyngeal Secretions: Not observed secondary MBS    Reason for Referral Objectively evaluate swallowing function   Oral Phase Oral Preparation/Oral Phase Oral Phase: WFL   Pharyngeal Phase Pharyngeal Phase Pharyngeal Phase: Impaired Pharyngeal - Thin Pharyngeal - Thin Straw: Penetration/Aspiration during swallow;Premature spillage to pyriform sinuses Penetration/Aspiration details (thin straw): Material enters airway, remains  ABOVE vocal cords then ejected out  Cervical  Esophageal Phase    GO    Cervical Esophageal Phase Cervical Esophageal Phase: Impaired Cervical Esophageal Phase - Comment Cervical Esophageal Comment:  (screen of esophagus revealed barium stasis distally)        Jaimie Pippins L. Samson Frederic, [place] CCC/SLP Pager [phone]  Blenda Mounts Laurice 05/22/2013, 4:42 PM

## 2013-05-22 NOTE — Evaluation (Signed)
Clinical/Bedside Swallow Evaluation Patient Details  Name: Patricia Roberson MRN: [id number] Date of Birth: [birth year]  Today's Date: 05/22/2013 Time: 1115-1138 SLP Time Calculation (min): 23 min  Past Medical History:  Past Medical History  Diagnosis Date  . Depression   . Hypertension   . CHF (congestive heart failure)   . Alzheimer's disease   . Edema   . Dementia in conditions classified elsewhere with behavioral disturbance(294.11)    Past Surgical History: History reviewed. No pertinent past surgical history. HPI:  79 y.o. female admitted to [facility] on 05/20/13 syncope, questionable cardiac arrest with return to spontaneous circulation after 6 minutes. Respiratory failure due to RLL PNA (? aspiration). Note that infiltrate was noted on f/u, not on original admission CXR.    Assessment / Plan / Recommendation Clinical Impression  Patient presents with a functional appearing oropharyngeal swallow without overt indication of aspiration at bedside. Significant SOB with decreased ability to maintain O2 saturation levels is largest risk factor for aspiration at this time. Discussed with daughter who reported recurrent episodes of PNA prior to admission. In light of recurrent PNAs, acute RLL PNA, h/o COPD, and h/o dementia, recommend continuing po diet (dysphagia 3 for energy conservation) with aspiration precautions however proceeding with instrumental swallowing study to ensure least restrictive diet with lowest aspiration risk is in place. Daughter in agreement following education.     Aspiration Risk  Moderate    Diet Recommendation Dysphagia 3 (Mechanical Soft);Thin liquid   Liquid Administration via: Cup;Straw Medication Administration: Crushed with puree (per RN, patient masticates pills) Supervision: Patient able to self feed;Full supervision/cueing for compensatory strategies Compensations: Slow rate;Small sips/bites (take frequent breaks to control SOB) Postural Changes and/or  Swallow Maneuvers: Seated upright 90 degrees    Other  Recommendations Recommended Consults: MBS Oral Care Recommendations: Oral care BID   Follow Up Recommendations   (TBD)       Pertinent Vitals/Pain None reported     Swallow Study    General HPI: 79 y.o. female admitted to [facility] on 05/20/13 syncope, questionable cardiac arrest with return to spontaneous circulation after 6 minutes. Respiratory failure due to RLL PNA (? aspiration). Note that infiltrate was noted on f/u, not on original admission CXR.  Type of Study: Bedside swallow evaluation Previous Swallow Assessment: none Diet Prior to this Study: Regular;Thin liquids Temperature Spikes Noted: Yes Respiratory Status: Nasal cannula History of Recent Intubation: No Behavior/Cognition: Alert;Cooperative;Pleasant mood Oral Cavity - Dentition: Adequate natural dentition Self-Feeding Abilities: Able to feed self Patient Positioning: Upright in chair Baseline Vocal Quality: Clear Volitional Cough: Weak Volitional Swallow: Able to elicit    Oral/Motor/Sensory Function Overall Oral Motor/Sensory Function: Appears within functional limits for tasks assessed   Ice Chips Ice chips: Not tested   Thin Liquid Thin Liquid: Within functional limits Presentation: Cup;Straw;Self Fed    Nectar Thick Nectar Thick Liquid: Not tested   Honey Thick Honey Thick Liquid: Not tested   Puree Puree: Within functional limits Presentation: Spoon   Solid   GO   Aniello Christopoulos MA, CCC-SLP [phone]  Solid: Within functional limits Presentation: Self Fed       Patricia Roberson 05/22/2013,11:44 AM

## 2013-05-22 NOTE — Progress Notes (Signed)
Patient is more alert and responsive.  Able to feed herself after tray is set-up.  Daughter spoke with Dr. Sharon Seller and she is very impressed with him.  Patient denies of pain at this time.

## 2013-05-23 ENCOUNTER — Encounter (HOSPITAL_COMMUNITY): Payer: Self-pay | Admitting: Emergency Medicine

## 2013-05-23 DIAGNOSIS — J189 Pneumonia, unspecified organism: Secondary | ICD-10-CM

## 2013-05-23 DIAGNOSIS — N390 Urinary tract infection, site not specified: Secondary | ICD-10-CM

## 2013-05-23 LAB — CBC
Hemoglobin: 10.6 g/dL — ABNORMAL LOW (ref 12.0–15.0)
MCH: 30.8 pg (ref 26.0–34.0)
MCV: 98.5 fL (ref 78.0–100.0)
RBC: 3.44 MIL/uL — ABNORMAL LOW (ref 3.87–5.11)
RDW: 18.6 % — ABNORMAL HIGH (ref 11.5–15.5)
WBC: 15.9 10*3/uL — ABNORMAL HIGH (ref 4.0–10.5)

## 2013-05-23 LAB — BASIC METABOLIC PANEL
BUN: 18 mg/dL (ref 6–23)
CO2: 28 mEq/L (ref 19–32)
GFR calc non Af Amer: 79 mL/min — ABNORMAL LOW (ref >90)
Glucose, Bld: 118 mg/dL — ABNORMAL HIGH (ref 70–99)
Potassium: 3.7 mEq/L (ref 3.5–5.1)
Sodium: 138 mEq/L (ref 135–145)

## 2013-05-23 MED ORDER — ATORVASTATIN CALCIUM 10 MG PO TABS: 5.0000 mg | ORAL_TABLET | Freq: Every day | ORAL | Status: DC

## 2013-05-23 MED ORDER — FUROSEMIDE 40 MG PO TABS
40.0000 mg | ORAL_TABLET | Freq: Every day | ORAL | Status: DC
  Administered 2013-05-23: 13:00:00 40 mg via ORAL
  Filled 2013-05-23 (×2): qty 1

## 2013-05-23 MED ORDER — MEMANTINE HCL 10 MG PO TABS: 10.0000 mg | ORAL_TABLET | Freq: Two times a day (BID) | ORAL | Status: DC

## 2013-05-23 MED ORDER — AMOXICILLIN-POT CLAVULANATE 875-125 MG PO TABS
1.0000 | ORAL_TABLET | Freq: Two times a day (BID) | ORAL | Status: DC
  Administered 2013-05-23 – 2013-05-24 (×3): 1 via ORAL
  Filled 2013-05-23 (×4): qty 1

## 2013-05-23 MED ORDER — FLUTICASONE PROPIONATE 50 MCG/ACT NA SUSP
2.0000 | Freq: Every day | NASAL | Status: DC
  Administered 2013-05-23 – 2013-05-24 (×2): 2 via NASAL
  Filled 2013-05-23: qty 16

## 2013-05-23 MED ORDER — SODIUM CHLORIDE 0.9 % IV SOLN
1.5000 g | Freq: Four times a day (QID) | INTRAVENOUS | Status: DC
  Filled 2013-05-23 (×3): qty 1.5

## 2013-05-23 MED ORDER — CALCIUM CARBONATE-VITAMIN D 500-200 MG-UNIT PO TABS
1.0000 | ORAL_TABLET | Freq: Two times a day (BID) | ORAL | Status: DC
  Administered 2013-05-23 – 2013-05-24 (×2): 1 via ORAL
  Filled 2013-05-23 (×4): qty 1

## 2013-05-23 MED ORDER — AZITHROMYCIN 500 MG PO TABS
500.0000 mg | ORAL_TABLET | Freq: Every day | ORAL | Status: DC
  Administered 2013-05-23 – 2013-05-24 (×2): 500 mg via ORAL
  Filled 2013-05-23 (×2): qty 1

## 2013-05-23 NOTE — Progress Notes (Signed)
Pt resting on bed comfortable, denies any chest pain or SOB at this time, VSS, no distress noticed.

## 2013-05-23 NOTE — Progress Notes (Signed)
Speech Language Pathology Treatment:    Patient Details Name: SAPHIA VANDERFORD MRN: [id number] DOB: [birth year] Today's Date: 05/23/2013 Time: 6213-0865 SLP Time Calculation (min): 26 min  Assessment / Plan / Recommendation Clinical Impression  RN reports pt. had difficulty swallowing whole pills earlier today.  RN also stated pt. had difficulty chewing some of the items on a regular diet.  SLP observe pt. with soft mechanical items which pt. tolerated well.  SLP posted aspiration and reflux precautions per recommendations from MBS. Pt. required  cues to follow precautions, (especially small sips).   HPI HPI: 79 y.o. female admitted to [facility] on 05/20/13 syncope, questionable cardiac arrest with return to spontaneous circulation after 6 minutes. Respiratory failure due to RLL PNA (? aspiration). Note that infiltrate was noted on f/u, not on original admission CXR.  On clinical swallow eval this am, pt presented with relatively functional swallow, but given hx of recurrent PNAs, acute RLL PNA, h/o COPD, and h/o dementia, instrumental swallow study was recommended.   Pertinent Vitals Lung sounds clear and pt. Remains afebrile.  SLP Plan  Continue with current plan of care, other than downgrading diet to Dysphagia 3 and crushing pills.   Recommendations Liquids provided via: Cup;Straw Medication Administration: Crushed with puree Supervision: Patient able to self feed;Full supervision/cueing for compensatory strategies Compensations: Slow rate;Small sips/bites Postural Changes and/or Swallow Maneuvers: Seated upright 90 degrees              Oral Care Recommendations: Oral care BID Plan: Continue with current plan of care    GO     Maryjo Rochester T 05/23/2013, 5:35 PM

## 2013-05-23 NOTE — Progress Notes (Addendum)
[facility] Progress Note   Patricia Roberson NWG:[id number] DOB: [birth year] DOA: 05/20/2013 PCP: No PCP Per Patient  Brief narrative: 79 year old SNF resident who was sent via EMS to the ER after an episode of syncope vs cardiac arrest. ER physician obtained hx from EMS. They were told by facility staff that the patient had a BM on commode and then stood up and collapsed. Staff member, reportedly, could not detect a pulse and CPR was performed for approximately 80m before paramedics arrived. Upon their arrival to the SNF, the patient was found to have good peripheral pulses but appeared tachypneic and had RA sats of 83%. She eventually returned to apparent baseline mentation and was successfully weaned to [place] oxygen. She transferred to SDU 05/21/13.   Assessment/Plan:  Acute respiratory failure with hypoxia due to Apiration pneumonitis and COPD (chronic obstructive pulmonary disease): - Initially hypoxic due to altered mentation - Recent CXR with RLL opacity - change to Azithro, augmentin. - COPD compensated w/o wheezing   Severe pulmonary HTN/RHF - Has associated mod/severe TR with RV dilatation, by echo on 05/20/2013 unchanged from 2011. - I will go ahead and resume her diuretics slowly. Her echo is unchanged from 2011 she has been on Lasix and spironolactone for years and monitor a be met in the hospital. Episode of hypotension could begin to the syncopal episode she had. Not to decrease in preload reduction from her diuretics she has been on for over 3 years.     Acute renal failure on CKD (chronic kidney disease), stage III -primarily 2/2 hypotension. -Space resume her Lasix as her creatinine is at baseline check a basic metabolic panel. Her blood pressure can tolerate it we'll resume spironolactone.  ANEMIA-IRON DEFICIENCY -hgb stable - colonoscopy not indicated due to advanced age   Unspecified essential hypertension -BP soft so will hold diuretics but will cont usual low dose  BB  Klebsiella UTI (lower urinary tract infection) - Urine culture shows sensitive to penicillins, Augmentin should cover. -blood cx's negative   Hypokalemia -replete and follow lytes  Dementia with behavioral disturbance -PT/OT eval - pt lives in a SNF already   PVD (peripheral vascular disease)   Consultants: None  Procedures: TTE - Left ventricle: Wall thickness was increased in a pattern of mild LVH. Systolic function was normal. The estimated ejection fraction was in the range of 55% to 65%. Wall motion was normal; there were no regional wall motion abnormalities. - Ventricular septum: Septal motion showed paradox. The contour showed systolic flattening. - Aortic valve: Trivial regurgitation. - Mitral valve: Calcified annulus. Mild regurgitation. - Right ventricle: The cavity size was moderately dilated. Systolic function was moderately to severely reduced. - Right atrium: The atrium was dilated. - Atrial septum: No defect or patent foramen ovale was identified. - Tricuspid valve: Moderate-severe regurgitation. - Pulmonary arteries: Systolic pressure was severely increased. PA peak pressure: 95mm Hg (S).  Antibiotics: Azithromycin 10.16 Rocephin 10/11 >>> 10.16 Flagyl 10/15 >>>10.16 Zosyn x 1 Vancomycin x 1 augmentin 10.16 HPI/Subjective: Awake in good mood and joyfull that she is feeling better.  Objective: Blood pressure 129/61, pulse 90, temperature 98.2 F (36.8 C), temperature source Oral, resp. rate 22, height 6' 3.98" (1.93 m), weight 85.367 kg (188 lb 3.2 oz), SpO2 95.00%.  Intake/Output Summary (Last 24 hours) at 05/23/13 0823 Last data filed at 05/23/13 0529  Gross per 24 hour  Intake   1750 ml  Output    200 ml  Net   1550 ml  Exam: General: No acute respiratory distress Lungs: Clear to auscultation bilaterally without wheezes or crackles, Cardiovascular: Regular rate and rhythm without murmur gallop or rub normal S1 and S2, no  peripheral edema  Abdomen: Nontender, nondistended, soft, bowel sounds positive, no rebound, no ascites, no appreciable mass Neurological: Awake and oriented x name, flat affect, moves all extremities x 4 without focal neurological deficits, CN 2-12 intact  Scheduled Meds:  Scheduled Meds: . amoxicillin-clavulanate  1 tablet Oral Q12H  . atorvastatin  5 mg Oral q1800  . azithromycin  500 mg Oral Daily  . calcium-vitamin D  1 tablet Oral BID WC  . donepezil  5 mg Oral QHS  . DULoxetine  60 mg Oral Daily  . feeding supplement (RESOURCE BREEZE)  1 Container Oral BID BM  . fluticasone  2 spray Each Nare Daily  . furosemide  40 mg Oral Daily  . heparin subcutaneous  5,000 Units Subcutaneous Q8H  . hydrALAZINE  10 mg Oral Q8H  . memantine  10 mg Oral BID  . metoprolol tartrate  6.25 mg Oral BID  . mometasone-formoterol  2 puff Inhalation BID  . pantoprazole  40 mg Oral Q1200  . polyethylene glycol  17 g Oral BID  . tiotropium  18 mcg Inhalation Daily   Data Reviewed: Basic Metabolic Panel:  Recent Labs Lab 05/20/13 0122 05/20/13 0139 05/20/13 0155 05/20/13 0650 05/21/13 0350 05/22/13 0521 05/23/13 0615  NA 136  --   --  137 145 141 138  K 4.1  --   --  3.5 3.8 3.3* 3.7  CL 97  --   --  98 105 102 100  CO2 20  --   --  27 27 29 28   GLUCOSE 120*  --   --  127* 87 113* 118*  BUN 42*  --   --  41* 30* 19 18  CREATININE 1.47* 1.90*  --  1.36* 0.90 0.72 0.66  CALCIUM 9.5  --   --  9.1 8.6 8.8 8.3*  MG  --   --  2.1  --   --   --   --    Liver Function Tests:  Recent Labs Lab 05/20/13 0122 05/22/13 0521  AST 169* 42*  ALT 207* 121*  ALKPHOS 73 72  BILITOT 1.1 0.8  PROT 6.9 6.3  ALBUMIN 3.0* 2.6*    Recent Labs Lab 05/20/13 0122  LIPASE 23   CBC:  Recent Labs Lab 05/20/13 0122 05/20/13 0650 05/21/13 0350 05/23/13 0615  WBC 31.8* 26.4* 21.2* 15.9*  NEUTROABS 28.9*  --   --   --   HGB 12.2 11.2* 11.3* 10.6*  HCT 37.2 34.5* 35.8* 33.9*  MCV 98.9 98.3 99.4  98.5  PLT 301 241 217 264   Cardiac Enzymes:  Recent Labs Lab 05/20/13 0122 05/20/13 0650 05/20/13 1015 05/20/13 1648  CKTOTAL 37  --   --   --   CKMB 3.6  --   --   --   TROPONINI  --  0.47* 0.60* 0.54*   BNP (last 3 results)  Recent Labs  05/20/13 0122  PROBNP 23984.0*   CBG:  Recent Labs Lab 05/20/13 1945 05/20/13 2349 05/21/13 0359 05/21/13 0837 05/21/13 1140  GLUCAP 75 88 87 101* 93    Recent Results (from the past 240 hour(s))  CULTURE, BLOOD (ROUTINE X 2)     Status: None   Collection Time    05/20/13  1:30 AM      Result Value  Range Status   Specimen Description BLOOD LEFT ARM   Final   Special Requests BOTTLES DRAWN AEROBIC ONLY 3.5CC   Final   Culture  Setup Time     Final   Value: 05/20/2013 09:13     Performed at [facility]   Culture     Final   Value:        BLOOD CULTURE RECEIVED NO GROWTH TO DATE CULTURE WILL BE HELD FOR 5 DAYS BEFORE ISSUING A FINAL NEGATIVE REPORT     Performed at [facility]   Report Status PENDING   Incomplete  CULTURE, BLOOD (ROUTINE X 2)     Status: None   Collection Time    05/20/13  1:55 AM      Result Value Range Status   Specimen Description BLOOD RIGHT HAND   Final   Special Requests BOTTLES DRAWN AEROBIC AND ANAEROBIC 10CC   Final   Culture  Setup Time     Final   Value: 05/20/2013 09:13     Performed at [facility]   Culture     Final   Value:        BLOOD CULTURE RECEIVED NO GROWTH TO DATE CULTURE WILL BE HELD FOR 5 DAYS BEFORE ISSUING A FINAL NEGATIVE REPORT     Performed at [facility]   Report Status PENDING   Incomplete  URINE CULTURE     Status: None   Collection Time    05/20/13  2:29 AM      Result Value Range Status   Specimen Description URINE, CATHETERIZED   Final   Special Requests ADDED 0302   Final   Culture  Setup Time     Final   Value: 05/20/2013 03:42     Performed at [facility] Count     Final   Value: >=100,000 COLONIES/ML      Performed at [facility]   Culture     Final   Value: KLEBSIELLA PNEUMONIAE     Performed at [facility]   Report Status 05/21/2013 FINAL   Final   Organism ID, Bacteria KLEBSIELLA PNEUMONIAE   Final  MRSA PCR SCREENING     Status: None   Collection Time    05/20/13  5:35 AM      Result Value Range Status   MRSA by PCR NEGATIVE  NEGATIVE Final   Comment:            The GeneXpert MRSA Assay (FDA     approved for NASAL specimens     only), is one component of a     comprehensive MRSA colonization     surveillance program. It is not     intended to diagnose MRSA     infection nor to guide or     monitor treatment for     MRSA infections.     Studies:  Recent x-ray studies have been reviewed in detail by the Attending Physician   On-Call/Text Page:      [email or web address]      password [email or web address]  If 7PM-7AM, please contact night-coverage [email or web address] Password [email or web address] 05/23/2013, 8:23 AM   LOS: 3 days

## 2013-05-23 NOTE — Progress Notes (Signed)
Patient received breathing treatment per PRN order for patient had some wheezing in bilateral upper lobes. Before breathing treatment, oxygen saturation was 90%. After breathing treatmeant oxygen saturation came up to 96%. Patient now wants to rest. Will continue to monitor to end of shift.

## 2013-05-24 ENCOUNTER — Encounter (HOSPITAL_COMMUNITY): Payer: Self-pay | Admitting: Emergency Medicine

## 2013-05-24 ENCOUNTER — Emergency Department (HOSPITAL_COMMUNITY): Payer: Medicare Other

## 2013-05-24 ENCOUNTER — Inpatient Hospital Stay (HOSPITAL_COMMUNITY)
Admission: EM | Admit: 2013-05-24 | Discharge: 2013-05-28 | Disposition: A | Discharge status: Hospice | Payer: Medicare Other | Source: Home / Self Care | Attending: Internal Medicine | Admitting: Internal Medicine

## 2013-05-24 DIAGNOSIS — I739 Peripheral vascular disease, unspecified: Secondary | ICD-10-CM

## 2013-05-24 DIAGNOSIS — J9600 Acute respiratory failure, unspecified whether with hypoxia or hypercapnia: Secondary | ICD-10-CM | POA: Diagnosis present

## 2013-05-24 DIAGNOSIS — F3289 Other specified depressive episodes: Secondary | ICD-10-CM | POA: Diagnosis present

## 2013-05-24 DIAGNOSIS — F411 Generalized anxiety disorder: Secondary | ICD-10-CM | POA: Diagnosis present

## 2013-05-24 DIAGNOSIS — I2789 Other specified pulmonary heart diseases: Secondary | ICD-10-CM | POA: Diagnosis present

## 2013-05-24 DIAGNOSIS — J449 Chronic obstructive pulmonary disease, unspecified: Secondary | ICD-10-CM | POA: Diagnosis present

## 2013-05-24 DIAGNOSIS — R609 Edema, unspecified: Secondary | ICD-10-CM

## 2013-05-24 DIAGNOSIS — R0600 Dyspnea, unspecified: Secondary | ICD-10-CM

## 2013-05-24 DIAGNOSIS — Z87440 Personal history of urinary (tract) infections: Secondary | ICD-10-CM

## 2013-05-24 DIAGNOSIS — Z860100 Personal history of colon polyps, unspecified: Secondary | ICD-10-CM

## 2013-05-24 DIAGNOSIS — E559 Vitamin D deficiency, unspecified: Secondary | ICD-10-CM

## 2013-05-24 DIAGNOSIS — F0280 Dementia in other diseases classified elsewhere without behavioral disturbance: Secondary | ICD-10-CM

## 2013-05-24 DIAGNOSIS — I5031 Acute diastolic (congestive) heart failure: Secondary | ICD-10-CM

## 2013-05-24 DIAGNOSIS — E785 Hyperlipidemia, unspecified: Secondary | ICD-10-CM | POA: Diagnosis present

## 2013-05-24 DIAGNOSIS — M810 Age-related osteoporosis without current pathological fracture: Secondary | ICD-10-CM

## 2013-05-24 DIAGNOSIS — J189 Pneumonia, unspecified organism: Secondary | ICD-10-CM | POA: Diagnosis present

## 2013-05-24 DIAGNOSIS — D72829 Elevated white blood cell count, unspecified: Secondary | ICD-10-CM

## 2013-05-24 DIAGNOSIS — D509 Iron deficiency anemia, unspecified: Secondary | ICD-10-CM | POA: Diagnosis present

## 2013-05-24 DIAGNOSIS — Z87891 Personal history of nicotine dependence: Secondary | ICD-10-CM

## 2013-05-24 DIAGNOSIS — J690 Pneumonitis due to inhalation of food and vomit: Principal | ICD-10-CM

## 2013-05-24 DIAGNOSIS — F0281 Dementia in other diseases classified elsewhere with behavioral disturbance: Secondary | ICD-10-CM | POA: Diagnosis present

## 2013-05-24 DIAGNOSIS — G4700 Insomnia, unspecified: Secondary | ICD-10-CM

## 2013-05-24 DIAGNOSIS — F329 Major depressive disorder, single episode, unspecified: Secondary | ICD-10-CM | POA: Diagnosis present

## 2013-05-24 DIAGNOSIS — N179 Acute kidney failure, unspecified: Secondary | ICD-10-CM

## 2013-05-24 DIAGNOSIS — Z9981 Dependence on supplemental oxygen: Secondary | ICD-10-CM

## 2013-05-24 DIAGNOSIS — R0603 Acute respiratory distress: Secondary | ICD-10-CM

## 2013-05-24 DIAGNOSIS — I10 Essential (primary) hypertension: Secondary | ICD-10-CM

## 2013-05-24 DIAGNOSIS — R0609 Other forms of dyspnea: Secondary | ICD-10-CM

## 2013-05-24 DIAGNOSIS — I5033 Acute on chronic diastolic (congestive) heart failure: Secondary | ICD-10-CM | POA: Diagnosis present

## 2013-05-24 DIAGNOSIS — N183 Chronic kidney disease, stage 3 unspecified: Secondary | ICD-10-CM

## 2013-05-24 DIAGNOSIS — I509 Heart failure, unspecified: Secondary | ICD-10-CM | POA: Diagnosis present

## 2013-05-24 DIAGNOSIS — G309 Alzheimer's disease, unspecified: Secondary | ICD-10-CM | POA: Diagnosis present

## 2013-05-24 DIAGNOSIS — J4489 Other specified chronic obstructive pulmonary disease: Secondary | ICD-10-CM | POA: Diagnosis present

## 2013-05-24 DIAGNOSIS — I129 Hypertensive chronic kidney disease with stage 1 through stage 4 chronic kidney disease, or unspecified chronic kidney disease: Secondary | ICD-10-CM | POA: Diagnosis present

## 2013-05-24 DIAGNOSIS — K5900 Constipation, unspecified: Secondary | ICD-10-CM

## 2013-05-24 DIAGNOSIS — J9601 Acute respiratory failure with hypoxia: Secondary | ICD-10-CM

## 2013-05-24 DIAGNOSIS — A0472 Enterocolitis due to Clostridium difficile, not specified as recurrent: Secondary | ICD-10-CM

## 2013-05-24 DIAGNOSIS — Z66 Do not resuscitate: Secondary | ICD-10-CM | POA: Diagnosis present

## 2013-05-24 DIAGNOSIS — E860 Dehydration: Secondary | ICD-10-CM

## 2013-05-24 DIAGNOSIS — E876 Hypokalemia: Secondary | ICD-10-CM

## 2013-05-24 DIAGNOSIS — Z515 Encounter for palliative care: Secondary | ICD-10-CM

## 2013-05-24 DIAGNOSIS — I5032 Chronic diastolic (congestive) heart failure: Secondary | ICD-10-CM | POA: Diagnosis present

## 2013-05-24 DIAGNOSIS — F02818 Dementia in other diseases classified elsewhere, unspecified severity, with other behavioral disturbance: Secondary | ICD-10-CM | POA: Diagnosis present

## 2013-05-24 DIAGNOSIS — N390 Urinary tract infection, site not specified: Secondary | ICD-10-CM

## 2013-05-24 DIAGNOSIS — F070 Personality change due to known physiological condition: Secondary | ICD-10-CM

## 2013-05-24 LAB — URINALYSIS, ROUTINE W REFLEX MICROSCOPIC
Glucose, UA: NEGATIVE mg/dL
Ketones, ur: NEGATIVE mg/dL
Nitrite: NEGATIVE
Specific Gravity, Urine: 1.021 (ref 1.005–1.030)
pH: 5 (ref 5.0–8.0)

## 2013-05-24 LAB — COMPREHENSIVE METABOLIC PANEL
Alkaline Phosphatase: 66 U/L (ref 39–117)
BUN: 19 mg/dL (ref 6–23)
CO2: 25 mEq/L (ref 19–32)
Calcium: 8.4 mg/dL (ref 8.4–10.5)
Chloride: 103 mEq/L (ref 96–112)
Creatinine, Ser: 0.81 mg/dL (ref 0.50–1.10)
GFR calc Af Amer: 75 mL/min — ABNORMAL LOW (ref >90)
GFR calc non Af Amer: 65 mL/min — ABNORMAL LOW (ref >90)
Glucose, Bld: 118 mg/dL — ABNORMAL HIGH (ref 70–99)
Potassium: 4.8 mEq/L (ref 3.5–5.1)
Total Bilirubin: 0.8 mg/dL (ref 0.3–1.2)
Total Protein: 6.5 g/dL (ref 6.0–8.3)

## 2013-05-24 LAB — POCT I-STAT 3, ART BLOOD GAS (G3+)
Acid-Base Excess: 3 mmol/L — ABNORMAL HIGH (ref 0.0–2.0)
Bicarbonate: 28.2 mEq/L — ABNORMAL HIGH (ref 20.0–24.0)
O2 Saturation: 100 %
Patient temperature: 98.6
TCO2: 29 mmol/L (ref 0–100)
pCO2 arterial: 42.2 mmHg (ref 35.0–45.0)
pH, Arterial: 7.433 (ref 7.350–7.450)
pO2, Arterial: 197 mmHg — ABNORMAL HIGH (ref 80.0–100.0)

## 2013-05-24 LAB — CBC WITH DIFFERENTIAL/PLATELET
Basophils Absolute: 0 10*3/uL (ref 0.0–0.1)
Eosinophils Absolute: 0.1 10*3/uL (ref 0.0–0.7)
Eosinophils Relative: 0 % (ref 0–5)
Hemoglobin: 11.2 g/dL — ABNORMAL LOW (ref 12.0–15.0)
Lymphocytes Relative: 5 % — ABNORMAL LOW (ref 12–46)
Lymphs Abs: 0.9 10*3/uL (ref 0.7–4.0)
MCH: 30.4 pg (ref 26.0–34.0)
MCV: 98.4 fL (ref 78.0–100.0)
Neutro Abs: 14.5 10*3/uL — ABNORMAL HIGH (ref 1.7–7.7)
Neutrophils Relative %: 82 % — ABNORMAL HIGH (ref 43–77)
Platelets: 319 10*3/uL (ref 150–400)
RBC: 3.69 MIL/uL — ABNORMAL LOW (ref 3.87–5.11)
RDW: 18.3 % — ABNORMAL HIGH (ref 11.5–15.5)
WBC: 17.7 10*3/uL — ABNORMAL HIGH (ref 4.0–10.5)

## 2013-05-24 LAB — URINE MICROSCOPIC-ADD ON

## 2013-05-24 LAB — BASIC METABOLIC PANEL
BUN: 17 mg/dL (ref 6–23)
CO2: 27 mEq/L (ref 19–32)
CO2: 27 mEq/L (ref 19–32)
Calcium: 8.2 mg/dL — ABNORMAL LOW (ref 8.4–10.5)
Chloride: 99 mEq/L (ref 96–112)
Chloride: 98 mEq/L (ref 96–112)
Creatinine, Ser: 0.64 mg/dL (ref 0.50–1.10)
GFR calc Af Amer: >90 mL/min (ref >90)
GFR calc Af Amer: >90 mL/min (ref >90)
GFR calc non Af Amer: 80 mL/min — ABNORMAL LOW (ref >90)
Glucose, Bld: 94 mg/dL (ref 70–99)
Glucose, Bld: 128 mg/dL — ABNORMAL HIGH (ref 70–99)
Potassium: 3.5 mEq/L (ref 3.5–5.1)
Sodium: 136 mEq/L (ref 135–145)
Sodium: 135 mEq/L (ref 135–145)

## 2013-05-24 LAB — PRO B NATRIURETIC PEPTIDE: Pro B Natriuretic peptide (BNP): 12808 pg/mL — ABNORMAL HIGH (ref 0–450)

## 2013-05-24 LAB — TROPONIN I: Troponin I: <0.3 ng/mL (ref <0.30)

## 2013-05-24 MED ORDER — IPRATROPIUM-ALBUTEROL 0.5-2.5 (3) MG/3ML IN SOLN: 3.0000 mL | Freq: Four times a day (QID) | RESPIRATORY_TRACT | Status: DC

## 2013-05-24 MED ORDER — ALBUTEROL SULFATE (5 MG/ML) 0.5% IN NEBU: 2.5000 mg | INHALATION_SOLUTION | Freq: Four times a day (QID) | RESPIRATORY_TRACT | Status: DC

## 2013-05-24 MED ORDER — ACETAMINOPHEN 650 MG RE SUPP: 650.0000 mg | Freq: Four times a day (QID) | RECTAL | Status: DC | PRN

## 2013-05-24 MED ORDER — PIPERACILLIN-TAZOBACTAM 3.375 G IVPB
3.3750 g | Freq: Three times a day (TID) | INTRAVENOUS | Status: AC
  Administered 2013-05-25 – 2013-05-26 (×6): 3.375 g via INTRAVENOUS
  Filled 2013-05-24 (×6): qty 50

## 2013-05-24 MED ORDER — FUROSEMIDE 10 MG/ML IJ SOLN
20.0000 mg | Freq: Two times a day (BID) | INTRAMUSCULAR | Status: DC
  Filled 2013-05-24 (×2): qty 2

## 2013-05-24 MED ORDER — METOPROLOL TARTRATE 1 MG/ML IV SOLN: 1.2500 mg | Freq: Four times a day (QID) | INTRAVENOUS | Status: DC | PRN

## 2013-05-24 MED ORDER — ALBUTEROL SULFATE (5 MG/ML) 0.5% IN NEBU
5.0000 mg | INHALATION_SOLUTION | Freq: Once | RESPIRATORY_TRACT | Status: AC
  Administered 2013-05-24: 5 mg via RESPIRATORY_TRACT
  Filled 2013-05-24: qty 1

## 2013-05-24 MED ORDER — VANCOMYCIN HCL IN DEXTROSE 1-5 GM/200ML-% IV SOLN
1000.0000 mg | INTRAVENOUS | Status: AC
  Administered 2013-05-25 – 2013-05-26 (×2): 1000 mg via INTRAVENOUS
  Filled 2013-05-24 (×2): qty 200

## 2013-05-24 MED ORDER — PIPERACILLIN-TAZOBACTAM 3.375 G IVPB 30 MIN
3.3750 g | Freq: Once | INTRAVENOUS | Status: AC
  Administered 2013-05-24: 3.375 g via INTRAVENOUS
  Filled 2013-05-24: qty 50

## 2013-05-24 MED ORDER — AZITHROMYCIN 500 MG PO TABS: 500.0000 mg | ORAL_TABLET | Freq: Every day | ORAL | Status: DC

## 2013-05-24 MED ORDER — IPRATROPIUM BROMIDE 0.02 % IN SOLN
0.5000 mg | Freq: Four times a day (QID) | RESPIRATORY_TRACT | Status: DC
  Administered 2013-05-25 – 2013-05-26 (×6): 0.5 mg via RESPIRATORY_TRACT
  Filled 2013-05-24 (×6): qty 2.5

## 2013-05-24 MED ORDER — ALUM & MAG HYDROXIDE-SIMETH 200-200-20 MG/5ML PO SUSP: 30.0000 mL | Freq: Four times a day (QID) | ORAL | Status: DC | PRN

## 2013-05-24 MED ORDER — ONDANSETRON HCL 4 MG/2ML IJ SOLN: 4.0000 mg | Freq: Four times a day (QID) | INTRAMUSCULAR | Status: DC | PRN

## 2013-05-24 MED ORDER — CHLORHEXIDINE GLUCONATE 0.12 % MT SOLN
15.0000 mL | Freq: Two times a day (BID) | OROMUCOSAL | Status: DC
  Administered 2013-05-25 – 2013-05-27 (×7): 15 mL via OROMUCOSAL
  Filled 2013-05-24 (×9): qty 15

## 2013-05-24 MED ORDER — FUROSEMIDE 20 MG PO TABS
20.0000 mg | ORAL_TABLET | Freq: Every day | ORAL | Status: DC
  Administered 2013-05-24: 20 mg via ORAL
  Filled 2013-05-24: qty 1

## 2013-05-24 MED ORDER — ONDANSETRON HCL 4 MG PO TABS: 4.0000 mg | ORAL_TABLET | Freq: Four times a day (QID) | ORAL | Status: DC | PRN

## 2013-05-24 MED ORDER — AMOXICILLIN-POT CLAVULANATE 875-125 MG PO TABS: 1.0000 | ORAL_TABLET | Freq: Two times a day (BID) | ORAL | Status: DC

## 2013-05-24 MED ORDER — HYDROMORPHONE HCL PF 1 MG/ML IJ SOLN: 0.5000 mg | INTRAMUSCULAR | Status: DC | PRN

## 2013-05-24 MED ORDER — ALBUTEROL SULFATE (5 MG/ML) 0.5% IN NEBU: 2.5000 mg | INHALATION_SOLUTION | RESPIRATORY_TRACT | Status: DC | PRN

## 2013-05-24 MED ORDER — VANCOMYCIN HCL IN DEXTROSE 1-5 GM/200ML-% IV SOLN
1000.0000 mg | Freq: Once | INTRAVENOUS | Status: AC
  Administered 2013-05-24: 1000 mg via INTRAVENOUS
  Filled 2013-05-24: qty 200

## 2013-05-24 MED ORDER — SODIUM CHLORIDE 0.9 % IV SOLN
INTRAVENOUS | Status: DC
  Administered 2013-05-25: 10 mL/h via INTRAVENOUS

## 2013-05-24 MED ORDER — FUROSEMIDE 40 MG PO TABS: 20.0000 mg | ORAL_TABLET | Freq: Every day | ORAL | Status: DC

## 2013-05-24 MED ORDER — ALBUTEROL SULFATE (5 MG/ML) 0.5% IN NEBU
2.5000 mg | INHALATION_SOLUTION | Freq: Four times a day (QID) | RESPIRATORY_TRACT | Status: DC
  Administered 2013-05-25 – 2013-05-26 (×6): 2.5 mg via RESPIRATORY_TRACT
  Filled 2013-05-24 (×6): qty 0.5

## 2013-05-24 MED ORDER — OXYCODONE HCL 5 MG PO TABS: 5.0000 mg | ORAL_TABLET | ORAL | Status: DC | PRN

## 2013-05-24 MED ORDER — PIPERACILLIN-TAZOBACTAM 3.375 G IVPB: 3.3750 g | Freq: Three times a day (TID) | INTRAVENOUS | Status: DC

## 2013-05-24 MED ORDER — METHYLPREDNISOLONE SODIUM SUCC 125 MG IJ SOLR
125.0000 mg | Freq: Once | INTRAMUSCULAR | Status: AC
  Administered 2013-05-24: 125 mg via INTRAVENOUS
  Filled 2013-05-24: qty 2

## 2013-05-24 MED ORDER — ENOXAPARIN SODIUM 30 MG/0.3ML ~~LOC~~ SOLN
30.0000 mg | SUBCUTANEOUS | Status: DC
  Administered 2013-05-25 (×2): 30 mg via SUBCUTANEOUS
  Filled 2013-05-24 (×3): qty 0.3

## 2013-05-24 MED ORDER — ACETAMINOPHEN 325 MG PO TABS: 650.0000 mg | ORAL_TABLET | Freq: Four times a day (QID) | ORAL | Status: DC | PRN

## 2013-05-24 MED ORDER — POTASSIUM CHLORIDE 20 MEQ/15ML (10%) PO LIQD
40.0000 meq | Freq: Two times a day (BID) | ORAL | Status: DC
  Administered 2013-05-24: 11:00:00 40 meq via ORAL
  Filled 2013-05-24 (×2): qty 30

## 2013-05-24 NOTE — ED Provider Notes (Signed)
CSN: [id number]     Arrival date & time 05/24/13  1843 History   First MD Initiated Contact with Patient 05/24/13 1903     Chief Complaint  Patient presents with  . Shortness of Breath   (Consider location/radiation/quality/duration/timing/severity/associated sxs/prior Treatment) HPI Patient was discharged from the hospital today and went back to the nursing home this afternoon. She had acute respiratory distress with saturation drop into the 70%. She is found to be dyspneic by the staff after walking to the bathroom. The patient denies any chest pain. Was recently admitted for shortness of breath felt possibly to be due to CHF versus aspiration pneumonia. Per old records the agent is a full code. Past Medical History  Diagnosis Date  . Depression   . Hypertension   . CHF (congestive heart failure)   . Alzheimer's disease   . Edema   . Dementia in conditions classified elsewhere with behavioral disturbance(294.11)    History reviewed. No pertinent past surgical history. No family history on file. History  Substance Use Topics  . Smoking status: Former Games developer  . Smokeless tobacco: Former Neurosurgeon  . Alcohol Use: Not on file   OB History   Grav Para Term Preterm Abortions TAB SAB Ect Mult Living                 Review of Systems  Constitutional: Negative for fever.  Respiratory: Positive for shortness of breath.   Cardiovascular: Negative for chest pain and leg swelling.  Gastrointestinal: Negative for nausea, vomiting and abdominal pain.  Skin: Positive for color change.    Allergies  Codeine; Levaquin; and Morphine  Home Medications   Current Outpatient Rx  Name  Route  Sig  Dispense  Refill  . acetaminophen (TYLENOL) 325 MG tablet   Oral   Take 650 mg by mouth 2 (two) times daily.         Marland Kitchen alendronate (FOSAMAX) 70 MG tablet   Oral   Take 70 mg by mouth every 7 (seven) days. Take with a full glass of water on an empty stomach. Takes on Saturday         .  amoxicillin-clavulanate (AUGMENTIN) 875-125 MG per tablet   Oral   Take 1 tablet by mouth every 12 (twelve) hours.         Marland Kitchen atorvastatin (LIPITOR) 10 MG tablet   Oral   Take 5 mg by mouth at bedtime.         Marland Kitchen azithromycin (ZITHROMAX) 500 MG tablet   Oral   Take 1 tablet (500 mg total) by mouth daily.      0   . Calcium Carb-Cholecalciferol 500-600 MG-UNIT CHEW   Oral   Chew 1 tablet by mouth 2 (two) times daily with a meal.         . chlorhexidine (PERIDEX) 0.12 % solution   Mouth/Throat   Use as directed 15 mLs in the mouth or throat 2 (two) times daily.         . Cholecalciferol (VITAMIN D-3) 1000 UNITS CAPS   Oral   Take 1,000 Units by mouth daily.         Marland Kitchen donepezil (ARICEPT) 5 MG tablet   Oral   Take 5 mg by mouth at bedtime.         . DULoxetine (CYMBALTA) 60 MG capsule   Oral   Take 60 mg by mouth daily.         . feeding supplement, RESOURCE BREEZE, (RESOURCE BREEZE)  LIQD   Oral   Take 60 mLs by mouth 2 (two) times daily.         . fluticasone (FLONASE) 50 MCG/ACT nasal spray   Nasal   Place 2 sprays into the nose daily.         . Fluticasone-Salmeterol (ADVAIR) 250-50 MCG/DOSE AEPB   Inhalation   Inhale 1 puff into the lungs every 12 (twelve) hours.         . furosemide (LASIX) 40 MG tablet   Oral   Take 0.5 tablets (20 mg total) by mouth daily.   30 tablet      . ipratropium-albuterol (DUONEB) 0.5-2.5 (3) MG/3ML SOLN   Nebulization   Take 3 mLs by nebulization every 6 (six) hours as needed (for shortness of breath).         . memantine (NAMENDA) 10 MG tablet   Oral   Take 10 mg by mouth 2 (two) times daily.         . metoprolol tartrate (LOPRESSOR) 25 MG tablet   Oral   Take 6.25 mg by mouth 2 (two) times daily.         . polyethylene glycol (MIRALAX / GLYCOLAX) packet   Oral   Take 17 g by mouth 2 (two) times daily.         Marland Kitchen spironolactone (ALDACTONE) 50 MG tablet   Oral   Take 50 mg by mouth daily.          Marland Kitchen tiotropium (SPIRIVA) 18 MCG inhalation capsule   Inhalation   Place 18 mcg into inhaler and inhale daily.          BP 114/75  Pulse 102  Resp 34  SpO2 100% Physical Exam  Nursing note and vitals reviewed. Constitutional: She appears well-developed and well-nourished. She appears distressed.  HENT:  Head: Normocephalic and atraumatic.  Mouth/Throat: Oropharynx is clear and moist. No oropharyngeal exudate.  Eyes: EOM are normal. Pupils are equal, round, and reactive to light.  Neck: Normal range of motion. Neck supple.  Cardiovascular: Normal rate and regular rhythm.   Pulmonary/Chest: No respiratory distress. She has wheezes (mild end expiratory wheezing in the bases bilaterally.). She has no rales.  Increased respiratory effort with tachypnea and mild costal retractions.  Abdominal: Soft. Bowel sounds are normal. She exhibits no distension. There is no tenderness. There is no rebound and no guarding.  Musculoskeletal: Normal range of motion. She exhibits no edema and no tenderness.  No calf swelling or tenderness.  Neurological: She is alert.  Oriented to person only. Moves all extremities without focal deficit.  Skin: Skin is warm and dry. No rash noted. No erythema.  Psychiatric: She has a normal mood and affect. Her behavior is normal.    ED Course  Procedures (including critical care time) Labs Review Labs Reviewed  COMPREHENSIVE METABOLIC PANEL  CBC WITH DIFFERENTIAL  PRO B NATRIURETIC PEPTIDE  TROPONIN I  URINALYSIS, ROUTINE W REFLEX MICROSCOPIC  BLOOD GAS, ARTERIAL   Imaging Review No results found.  EKG Interpretation   None      CRITICAL CARE Performed by: Ranae Palms, Waneda Klammer Total critical care time: 30 min Critical care time was exclusive of separately billable procedures and treating other patients. Critical care was necessary to treat or prevent imminent or life-threatening deterioration. Critical care was time spent personally by me on the  following activities: development of treatment plan with patient and/or surrogate as well as nursing, discussions with consultants, evaluation of patient's response to treatment,  examination of patient, obtaining history from patient or surrogate, ordering and performing treatments and interventions, ordering and review of laboratory studies, ordering and review of radiographic studies, pulse oximetry and re-evaluation of patient's condition. ca MDM  Patient presenting similar to her initial presentation earlier this week. Her oxygen saturations have improved on nonrebreather. The patient does not show rapid improvement of her work of breathing will consider BiPAP. We'll try to give a albuterol neb to see if that has any effect on the patient's breathing status.  Daughter at bedside. She states that patient was lying flat in her room without oxygen when she found her at the nursing home. She was saying that she was short of breath. She was diaphoretic. Daughter lifted the patient up to a seated position and started back her oxygen. She alerted the nurses and they called EMS.  Patient now breathing much more comfortably on a BiPAP. The signs of costal retractions. Discussed with [facility], will admit to step down bed. Asked to have patient started on broad-spectrum antibiotics for possible pneumonia.  Loren Racer, MD 05/24/13 2146

## 2013-05-24 NOTE — ED Notes (Signed)
MD at bedside. 

## 2013-05-24 NOTE — H&P (Addendum)
[facility] History and Physical  CAMELLIA POPESCU ZOX:[id number] DOB: Nov 07, 1926 DOA: 05/24/2013  Referring physician: EDP PCP: No PCP Per Patient  Specialists:   Chief Complaint: SOB  HPI: TONDA WIEDERHOLD is a 79 y.o. female resident of the [facility] who returned from the hospital to the Facility and was found an hour later cyanotic, and tachypneic in her room.  The Staff placed her on [place] O2, and found her O2 saturations in the 70's.   Her daughter was at the bedside and requested that her mother be taken by EMS back to the hospital.    In the ED,  she was placed on BIPAP.     She was hospitalized 1 week ago for similar symptoms and was treated for Sepsis, UTI, Pneumonia, and CHF.  She had been treated at the SNF for RLL pneumonia due to aspiration 3 weeks ago, and was treated while inpatient for Sepsis, UTI and PNA and discharged on Augmentin, and Azithromycin.  She has been referred for medical admission and placed on IV Vancomycin and Zosyn to cover HCAP as well as a resolving UTI.  The medical history has been obtained from the patient's Daugther who is at the Bedside, the EDP and the medical records.      Review of Systems: Unable to Obtain from the patient  Past Medical History  Diagnosis Date  . Depression   . Hypertension   . CHF (congestive heart failure)   . Alzheimer's disease   . Edema   . Dementia in conditions classified elsewhere with behavioral disturbance(294.11)     History reviewed. No pertinent past surgical history.      Prior to Admission medications   Medication Sig Start Date End Date Taking? Authorizing Provider  acetaminophen (TYLENOL) 325 MG tablet Take 650 mg by mouth 2 (two) times daily.   Yes Historical Provider, MD  alendronate (FOSAMAX) 70 MG tablet Take 70 mg by mouth every 7 (seven) days. Take with a full glass of water on an empty stomach. Takes on Saturday   Yes Historical Provider, MD  amoxicillin-clavulanate (AUGMENTIN) 875-125 MG  per tablet Take 1 tablet by mouth every 12 (twelve) hours. 05/24/13 05/28/13 Yes Marinda Elk, MD  atorvastatin (LIPITOR) 10 MG tablet Take 5 mg by mouth at bedtime.   Yes Historical Provider, MD  azithromycin (ZITHROMAX) 500 MG tablet Take 1 tablet (500 mg total) by mouth daily. 05/24/13 05/28/13 Yes Marinda Elk, MD  Calcium Carb-Cholecalciferol 500-600 MG-UNIT CHEW Chew 1 tablet by mouth 2 (two) times daily with a meal.   Yes Historical Provider, MD  chlorhexidine (PERIDEX) 0.12 % solution Use as directed 15 mLs in the mouth or throat 2 (two) times daily.   Yes Historical Provider, MD  Cholecalciferol (VITAMIN D-3) 1000 UNITS CAPS Take 1,000 Units by mouth daily.   Yes Historical Provider, MD  donepezil (ARICEPT) 5 MG tablet Take 5 mg by mouth at bedtime.   Yes Historical Provider, MD  DULoxetine (CYMBALTA) 60 MG capsule Take 60 mg by mouth daily.   Yes Historical Provider, MD  feeding supplement, RESOURCE BREEZE, (RESOURCE BREEZE) LIQD Take 60 mLs by mouth 2 (two) times daily.   Yes Historical Provider, MD  fluticasone (FLONASE) 50 MCG/ACT nasal spray Place 2 sprays into the nose daily.   Yes Historical Provider, MD  Fluticasone-Salmeterol (ADVAIR) 250-50 MCG/DOSE AEPB Inhale 1 puff into the lungs every 12 (twelve) hours.   Yes Historical Provider, MD  furosemide (LASIX) 40 MG  tablet Take 0.5 tablets (20 mg total) by mouth daily. 05/24/13  Yes Marinda Elk, MD  ipratropium-albuterol (DUONEB) 0.5-2.5 (3) MG/3ML SOLN Take 3 mLs by nebulization every 6 (six) hours as needed (for shortness of breath).   Yes Historical Provider, MD  memantine (NAMENDA) 10 MG tablet Take 10 mg by mouth 2 (two) times daily.   Yes Historical Provider, MD  metoprolol tartrate (LOPRESSOR) 25 MG tablet Take 6.25 mg by mouth 2 (two) times daily.   Yes Historical Provider, MD  polyethylene glycol (MIRALAX / GLYCOLAX) packet Take 17 g by mouth 2 (two) times daily.   Yes Historical Provider, MD   spironolactone (ALDACTONE) 50 MG tablet Take 50 mg by mouth daily.   Yes Historical Provider, MD  tiotropium (SPIRIVA) 18 MCG inhalation capsule Place 18 mcg into inhaler and inhale daily.   Yes Historical Provider, MD    Allergies  Allergen Reactions  . Codeine Other (See Comments)    unknown  . Levaquin [Levofloxacin] Other (See Comments)    unknown  . Morphine Other (See Comments)    unknown     Social History:   Current Resident of [facility] for the past 11 years.          Widow ( 2 previous marriages) Previous history of ETOH Abuse:  (Stopped Drinking when became a resident in [facility]) Previous hx of Smoking but Quit No History of Illicit Drug Usage      Family History:            Father Died in WWII         Mother Died Of Ruptured Cerebral Aneurysm   Physical Exam:  GEN:  Obtunded Elderly 79 y.o. well developed Caucasian female  examined  and in Acute distress;  Filed Vitals:   05/24/13 2145 05/24/13 2215 05/24/13 2255 05/24/13 2300  BP: 118/60 111/46    Pulse: 86 83    Resp: 25 23 21    Height:    5\' 4"  (1.626 m)  Weight:    84.4 kg (186 lb 1.1 oz)  SpO2: 98% 99% 97%    Blood pressure 111/46, pulse 83, resp. rate 21, height 5\' 4"  (1.626 m), weight 84.4 kg (186 lb 1.1 oz), SpO2 97.00%. PSYCH: She is Obtunded;   Grimaces to Verbal and Tactile Stimuli;    BIPAP Mask in place HEENT: Normocephalic and Atraumatic, Mucous membranes pink; PERRLA; EOM intact; Fundi:  Benign;  No scleral icterus, Nares: Patent, Oropharynx: Clear,  Neck:  FROM, no cervical lymphadenopathy nor thyromegaly or carotid bruit; no JVD; Breasts:: Not examined CHEST WALL: No tenderness CHEST: Normal respiration, clear to auscultation bilaterally HEART: Regular rate and rhythm; no murmurs rubs or gallops BACK: No kyphosis or scoliosis; no CVA tenderness ABDOMEN: Positive Bowel Sounds, Soft non-tender; no masses, no organomegaly, no pannus; no intertriginous candida. Rectal  Exam: Not done EXTREMITIES: No cyanosis, clubbing or edema; no ulcerations. Genitalia: not examined PULSES: 2+ and symmetric SKIN: Normal hydration no rash or ulceration CNS:  Obtunded,  Grimacing to Verbal and tactile Stimuli,  Able to withdraw to painful stimuli and move all extremities.       Labs on Admission:  Basic Metabolic Panel:  Recent Labs Lab 05/20/13 0155  05/22/13 0521 05/23/13 0615 05/24/13 0400 05/24/13 1005 05/24/13 1907  NA  --   < > 141 138 136 135 140  K  --   < > 3.3* 3.7 3.4* 3.5 4.8  CL  --   < >  102 100 99 98 103  CO2  --   < > 29 28 27 27 25   GLUCOSE  --   < > 113* 118* 94 128* 118*  BUN  --   < > 19 18 16 17 19   CREATININE  --   < > 0.72 0.66 0.64 0.63 0.81  CALCIUM  --   < > 8.8 8.3* 8.2* 8.5 8.4  MG 2.1  --   --   --   --   --   --   < > = values in this interval not displayed. Liver Function Tests:  Recent Labs Lab 05/20/13 0122 05/22/13 0521 05/24/13 1907  AST 169* 42* 17  ALT 207* 121* 56*  ALKPHOS 73 72 66  BILITOT 1.1 0.8 0.8  PROT 6.9 6.3 6.5  ALBUMIN 3.0* 2.6* 2.5*    Recent Labs Lab 05/20/13 0122  LIPASE 23   No results found for this basename: AMMONIA,  in the last 168 hours CBC:  Recent Labs Lab 05/20/13 0122 05/20/13 0650 05/21/13 0350 05/23/13 0615 05/24/13 1907  WBC 31.8* 26.4* 21.2* 15.9* 17.7*  NEUTROABS 28.9*  --   --   --  14.5*  HGB 12.2 11.2* 11.3* 10.6* 11.2*  HCT 37.2 34.5* 35.8* 33.9* 36.3  MCV 98.9 98.3 99.4 98.5 98.4  PLT 301 241 217 264 319   Cardiac Enzymes:  Recent Labs Lab 05/20/13 0122 05/20/13 0650 05/20/13 1015 05/20/13 1648 05/24/13 1907  CKTOTAL 37  --   --   --   --   CKMB 3.6  --   --   --   --   TROPONINI  --  0.47* 0.60* 0.54* <0.30    BNP (last 3 results)  Recent Labs  05/20/13 0122 05/24/13 1907  PROBNP 23984.0* 12808.0*   CBG:  Recent Labs Lab 05/20/13 1945 05/20/13 2349 05/21/13 0359 05/21/13 0837 05/21/13 1140  GLUCAP 75 88 87 101* 93     Radiological Exams on Admission: Dg Chest Port 1 View  05/24/2013   CLINICAL DATA:  Shortness of breath.  EXAM: PORTABLE CHEST - 1 VIEW  COMPARISON:  05/21/2013.  FINDINGS: The heart is mildly enlarged but stable. The mediastinal and hilar contours are unchanged. Persistent right basilar opacity could reflect atelectasis or pneumonia. There are small bilateral pleural effusions.  IMPRESSION: Persistent right basilar opacity and small effusions.   Electronically Signed   By: Loralie Champagne M.D.   On: 05/24/2013 19:56     EKG:  Ordered    Assessment/Plan Principal Problem:  Respiratory distress   Active Problems:   Acute diastolic heart failure   HCAP (healthcare-associated pneumonia)   COPD (chronic obstructive pulmonary disease)   Increased Transaminases   CKD (chronic kidney disease), stage III   ANEMIA-IRON DEFICIENCY   Unspecified vitamin D deficiency   Other and unspecified hyperlipidemia   Leukocytosis, unspecified   Alzheimer's disease    1.  Respiratory Distress- Multifactorial etiologies,   Acute on Chronic CHF, HCAP, Possible recurrent Aspiration, and hx of COPD.   Placed on BIPAP, and the CHF Protocol with IV lasix, Previous 2 D ECHO 05/21/2013.     and expanded her ABX coverage to cover HCAP, and also placed on DUONEB Rxs.  Had Swallow Evaluation on 10 15/2014.      2.  HCAP, CHF, COPD- see #1  3.  Increased Transaminases-  Due to Hepatic Congestion from CHF,   Monitor Trend.    4.   CKD-  BUN/Cr improved  from previous but probably Due to Fluid overload and dilution,   Has a hx of CKD stage III.     5.   Anemia hx-   Mildly decreased Hb:  Now = 11.2 from 12.2  , MCV = 98.4 normocytic Indices  So  Most likely Dilutional.  Monitor Trend.    6.   Leukocytosis-  Improved from previous, monitor trend. The trend has been decreasing;  WBCs:  Now  17.7  from  31.8 on 10/13.    7.   Vit D Deficiency- Resume Alendronate, and Vit D and Calcium supplements when off BiPAP  and able to take PO.    8.    Hyperlipidemia-   Resume Atorvastatin when able to take PO.    9.    Alzheimer's Dementia-  Resume Aricept and Namenda Rx when able to take PO.    10.  DVT prophylaxis with Lovenox.     11.   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       Code Status:    FULL CODE Family Communication:    Daughter (POA) and Son-In-Law at Bedside Disposition Plan:     Inopatient  Time spent: 75 minutes   Ron Parker [facility] Pager [phone]  If 7PM-7AM, please contact night-coverage [email or web address] Password [email or web address] 05/24/2013, 11:56 PM

## 2013-05-24 NOTE — ED Notes (Signed)
Dr. Jenkins at bedside. 

## 2013-05-24 NOTE — Progress Notes (Signed)
Pt in bed O to self no compliants of sob or Cp. Will continue to monitor

## 2013-05-24 NOTE — Progress Notes (Signed)
[facility] Progress Note   Patricia Roberson ZOX:[id number] DOB: Jun 11, 1927 DOA: 05/20/2013 PCP: No PCP Per Patient  Brief narrative: 79 year old SNF resident who was sent via EMS to the ER after an episode of syncope vs cardiac arrest. ER physician obtained hx from EMS. They were told by facility staff that the patient had a BM on commode and then stood up and collapsed. Staff member, reportedly, could not detect a pulse and CPR was performed for approximately 5m before paramedics arrived. Upon their arrival to the SNF, the patient was found to have good peripheral pulses but appeared tachypneic and had RA sats of 83%. She eventually returned to apparent baseline mentation and was successfully weaned to [place] oxygen. She transferred to SDU 05/21/13.   Assessment/Plan:  Acute respiratory failure with hypoxia due to Apiration pneumonitis and COPD (chronic obstructive pulmonary disease): - Initially hypoxic due to altered mentation - Recent CXR with RLL opacity - change to Azithro, augmentin. Afebrile. - COPD compensated w/o wheezing.  back to SNF on 10.17.2014  Severe pulmonary HTN/RHF: - Has associated mod/severe TR with RV dilatation, by echo on 05/20/2013 unchanged from 2011. - I will go ahead and resume her diuretics slowly. Her echo is unchanged from 2011 she has been on Lasix and spironolactone for years and monitor a be met in the hospital. Episode of hypotension could begin to the syncopal episode she had. Not to decrease in preload reduction from her diuretics she has been on for over 3 years.   Acute renal failure on CKD (chronic kidney disease), stage III -primarily 2/2 hypotension. - Cont her Lasix as her creatinine is at baseline. - Resume spironolactone if Her blood pressure can tolerate it.  ANEMIA-IRON DEFICIENCY -hgb stable - colonoscopy not indicated due to advanced age   Unspecified essential hypertension -BP soft so will hold diuretics but will cont usual low dose  BB  Klebsiella UTI (lower urinary tract infection) - Urine culture shows sensitive to penicillins, Augmentin should cover. -blood cx's negative   Hypokalemia -replete and follow lytes  Dementia with behavioral disturbance -PT/OT eval - pt lives in a SNF already   PVD (peripheral vascular disease)   Consultants: None  Procedures: TTE - Left ventricle: Wall thickness was increased in a pattern of mild LVH. Systolic function was normal. The estimated ejection fraction was in the range of 55% to 65%. Wall motion was normal; there were no regional wall motion abnormalities. - Ventricular septum: Septal motion showed paradox. The contour showed systolic flattening. - Aortic valve: Trivial regurgitation. - Mitral valve: Calcified annulus. Mild regurgitation. - Right ventricle: The cavity size was moderately dilated. Systolic function was moderately to severely reduced. - Right atrium: The atrium was dilated. - Atrial septum: No defect or patent foramen ovale was identified. - Tricuspid valve: Moderate-severe regurgitation. - Pulmonary arteries: Systolic pressure was severely increased. PA peak pressure: 95mm Hg (S).  Antibiotics: Azithromycin 10.16 Rocephin 10/11 >>> 10.16 Flagyl 10/15 >>>10.16 Zosyn x 1 Vancomycin x 1 augmentin 10.16 HPI/Subjective: Awake in good mood and joyfull that she is feeling better. No complains  Objective: Blood pressure 116/52, pulse 93, temperature 98.8 F (37.1 C), temperature source Oral, resp. rate 22, height 6' 3.98" (1.93 m), weight 85.367 kg (188 lb 3.2 oz), SpO2 91.00%.  Intake/Output Summary (Last 24 hours) at 05/24/13 0856 Last data filed at 05/24/13 0617  Gross per 24 hour  Intake    238 ml  Output    625 ml  Net   -  387 ml    Exam: General: No acute respiratory distress Lungs: Clear to auscultation bilaterally without wheezes or crackles, Cardiovascular: Regular rate and rhythm without murmur gallop or rub normal S1 and  S2, no peripheral edema  Abdomen: Nontender, nondistended, soft, bowel sounds positive, no rebound, no ascites, no appreciable mass Neurological: Awake and oriented x name, flat affect, moves all extremities x 4 without focal neurological deficits, CN 2-12 intact  Scheduled Meds:  Scheduled Meds: . amoxicillin-clavulanate  1 tablet Oral Q12H  . atorvastatin  5 mg Oral q1800  . azithromycin  500 mg Oral Daily  . calcium-vitamin D  1 tablet Oral BID WC  . donepezil  5 mg Oral QHS  . DULoxetine  60 mg Oral Daily  . feeding supplement (RESOURCE BREEZE)  1 Container Oral BID BM  . fluticasone  2 spray Each Nare Daily  . furosemide  40 mg Oral Daily  . heparin subcutaneous  5,000 Units Subcutaneous Q8H  . hydrALAZINE  10 mg Oral Q8H  . memantine  10 mg Oral BID  . metoprolol tartrate  6.25 mg Oral BID  . mometasone-formoterol  2 puff Inhalation BID  . pantoprazole  40 mg Oral Q1200  . polyethylene glycol  17 g Oral BID  . potassium chloride  40 mEq Oral BID  . tiotropium  18 mcg Inhalation Daily   Data Reviewed: Basic Metabolic Panel:  Recent Labs Lab 05/20/13 0155 05/20/13 0650 05/21/13 0350 05/22/13 0521 05/23/13 0615 05/24/13 0400  NA  --  137 145 141 138 136  K  --  3.5 3.8 3.3* 3.7 3.4*  CL  --  98 105 102 100 99  CO2  --  27 27 29 28 27   GLUCOSE  --  127* 87 113* 118* 94  BUN  --  41* 30* 19 18 16   CREATININE  --  1.36* 0.90 0.72 0.66 0.64  CALCIUM  --  9.1 8.6 8.8 8.3* 8.2*  MG 2.1  --   --   --   --   --    Liver Function Tests:  Recent Labs Lab 05/20/13 0122 05/22/13 0521  AST 169* 42*  ALT 207* 121*  ALKPHOS 73 72  BILITOT 1.1 0.8  PROT 6.9 6.3  ALBUMIN 3.0* 2.6*    Recent Labs Lab 05/20/13 0122  LIPASE 23   CBC:  Recent Labs Lab 05/20/13 0122 05/20/13 0650 05/21/13 0350 05/23/13 0615  WBC 31.8* 26.4* 21.2* 15.9*  NEUTROABS 28.9*  --   --   --   HGB 12.2 11.2* 11.3* 10.6*  HCT 37.2 34.5* 35.8* 33.9*  MCV 98.9 98.3 99.4 98.5  PLT 301  241 217 264   Cardiac Enzymes:  Recent Labs Lab 05/20/13 0122 05/20/13 0650 05/20/13 1015 05/20/13 1648  CKTOTAL 37  --   --   --   CKMB 3.6  --   --   --   TROPONINI  --  0.47* 0.60* 0.54*   BNP (last 3 results)  Recent Labs  05/20/13 0122  PROBNP 23984.0*   CBG:  Recent Labs Lab 05/20/13 1945 05/20/13 2349 05/21/13 0359 05/21/13 0837 05/21/13 1140  GLUCAP 75 88 87 101* 93    Recent Results (from the past 240 hour(s))  CULTURE, BLOOD (ROUTINE X 2)     Status: None   Collection Time    05/20/13  1:30 AM      Result Value Range Status   Specimen Description BLOOD LEFT ARM   Final  Special Requests BOTTLES DRAWN AEROBIC ONLY 3.5CC   Final   Culture  Setup Time     Final   Value: 05/20/2013 09:13     Performed at [facility]   Culture     Final   Value:        BLOOD CULTURE RECEIVED NO GROWTH TO DATE CULTURE WILL BE HELD FOR 5 DAYS BEFORE ISSUING A FINAL NEGATIVE REPORT     Performed at [facility]   Report Status PENDING   Incomplete  CULTURE, BLOOD (ROUTINE X 2)     Status: None   Collection Time    05/20/13  1:55 AM      Result Value Range Status   Specimen Description BLOOD RIGHT HAND   Final   Special Requests BOTTLES DRAWN AEROBIC AND ANAEROBIC 10CC   Final   Culture  Setup Time     Final   Value: 05/20/2013 09:13     Performed at [facility]   Culture     Final   Value:        BLOOD CULTURE RECEIVED NO GROWTH TO DATE CULTURE WILL BE HELD FOR 5 DAYS BEFORE ISSUING A FINAL NEGATIVE REPORT     Performed at [facility]   Report Status PENDING   Incomplete  URINE CULTURE     Status: None   Collection Time    05/20/13  2:29 AM      Result Value Range Status   Specimen Description URINE, CATHETERIZED   Final   Special Requests ADDED 0302   Final   Culture  Setup Time     Final   Value: 05/20/2013 03:42     Performed at [facility] Count     Final   Value: >=100,000 COLONIES/ML     Performed at  [facility]   Culture     Final   Value: KLEBSIELLA PNEUMONIAE     Performed at [facility]   Report Status 05/21/2013 FINAL   Final   Organism ID, Bacteria KLEBSIELLA PNEUMONIAE   Final  MRSA PCR SCREENING     Status: None   Collection Time    05/20/13  5:35 AM      Result Value Range Status   MRSA by PCR NEGATIVE  NEGATIVE Final   Comment:            The GeneXpert MRSA Assay (FDA     approved for NASAL specimens     only), is one component of a     comprehensive MRSA colonization     surveillance program. It is not     intended to diagnose MRSA     infection nor to guide or     monitor treatment for     MRSA infections.     Studies:  Recent x-ray studies have been reviewed in detail by the Attending Physician    On-Call/Text Page:      [email or web address]      password [email or web address]  If 7PM-7AM, please contact night-coverage [email or web address] Password [email or web address] 05/24/2013, 8:56 AM   LOS: 4 days

## 2013-05-24 NOTE — Progress Notes (Signed)
Speech Language Pathology Treatment:    Patient Details Name: Patricia Roberson MRN: [id number] DOB: 11-26-1926 Today's Date: 05/24/2013 Time: 1355-1406 SLP Time Calculation (min): 11 min  Assessment / Plan / Recommendation Clinical Impression  Session focused on educating pt and daughter re: relationship between breathing and swallowing, the impact of increased RR upon swallow function, and strategies to facilitate safety with eating.  Reviewed results of MBS and recommendations to minimize aspiration.  Pt's daughter verbalized understanding and her plan to discuss recs with staff at [facility].  Goals met.  No further SLP f/u needed.   HPI HPI: 79 y.o. female admitted to [facility] on 05/20/13 syncope, questionable cardiac arrest with return to spontaneous circulation after 6 minutes. Respiratory failure due to RLL PNA (? aspiration). Note that infiltrate was noted on f/u, not on original admission CXR.  On clinical swallow eval this am, pt presented with relatively functional swallow, but given hx of recurrent PNAs, acute RLL PNA, h/o COPD, and h/o dementia, instrumental swallow study was recommended.      SLP Plan  All goals met    Recommendations Diet recommendations: Dysphagia 3 (mechanical soft);Thin liquid Liquids provided via: Cup;Straw Medication Administration: Crushed with puree Supervision: Patient able to self feed;Full supervision/cueing for compensatory strategies Compensations: Slow rate;Small sips/bites Postural Changes and/or Swallow Maneuvers: Seated upright 90 degrees              Plan: All goals met        Brenn Gatton L. Samson Frederic, [place] CCC/SLP Pager [phone]  Patricia Roberson 05/24/2013, 2:20 PM

## 2013-05-24 NOTE — Progress Notes (Signed)
Pt d/c SNF report called

## 2013-05-24 NOTE — Progress Notes (Signed)
Occupational Therapy Treatment Patient Details Name: Patricia Roberson MRN: [id number] DOB: 11-06-1926 Today's Date: 05/24/2013 Time: 1025-1039 OT Time Calculation (min): 14 min  OT Assessment / Plan / Recommendation  History of present illness 79 y.o. female admitted to [facility] on 05/20/13 syncope, questionable cardiac arrest with return to spontaneous circulation after 6 minutes. Respiratory failure.   OT comments  Pt making slow progress with functional goals, but is pleasant and cooperative. Pt fatigues easily and is confused about where she is. Pt stated to OT " why [place] ? " Pt's O2 SATs at 93% before activity while in supine, dropped to 88% after bed mobility to EOB. Pt instructed on breathing techniques and O2 returned to 92% after 3 minute rest period  Follow Up Recommendations  SNF;Supervision/Assistance - 24 hour    Barriers to Discharge   None    Equipment Recommendations  None recommended by OT    Recommendations for Other Services    Frequency Min 2X/week   Progress towards OT Goals Progress towards OT goals: Progressing toward goals  Plan Discharge plan remains appropriate    Precautions / Restrictions  Monitor O2 SATS  Pertinent Vitals/Pain No c/o pain    ADL  Grooming: Performed;Min guard;Set up Where Assessed - Grooming: Supported sitting Upper Body Bathing: Maximal assistance;Moderate assistance Where Assessed - Upper Body Bathing: Supported sitting Upper Body Dressing: Performed;Maximal assistance;Moderate assistance Where Assessed - Upper Body Dressing: Supported sitting Transfers/Ambulation Related to ADLs: pt declined transfers to [facility] ADL Comments: pt required simple step by step instructions to complete tasks    OT Diagnosis:    OT Problem List:   OT Treatment Interventions:     OT Goals(current goals can now be found in the care plan section) ADL Goals Pt Will Perform Grooming: with set-up;with supervision;sitting (EOB) Pt Will  Perform Upper Body Bathing: with mod assist;sitting Pt Will Perform Upper Body Dressing: with mod assist;sitting Pt Will Transfer to Toilet: with min assist;with min guard assist;bedside commode  Visit Information  Last OT Received On: 05/24/13 Assistance Needed: +1 History of Present Illness: 79 y.o. female admitted to [facility] on 05/20/13 syncope, questionable cardiac arrest with return to spontaneous circulation after 6 minutes. Respiratory failure.    Subjective Data      Prior Functioning       Cognition  Cognition Arousal/Alertness: Awake/alert Overall Cognitive Status: History of cognitive impairments - at baseline    Mobility  Bed Mobility Bed Mobility: Supine to Sit;Sitting - Scoot to Edge of Bed Supine to Sit: 3: Mod assist;HOB elevated;With rails Sitting - Scoot to Edge of Bed: 2: Max assist;3: Mod assist Sit to Supine: 3: Mod assist;With rail;HOB flat Details for Bed Mobility Assistance: Pt requires simple step by step instructions, pt disoriented to time and place Transfers Transfers: Not assessed    Exercises      Balance Balance Balance Assessed: Yes Dynamic Sitting Balance Dynamic Sitting - Balance Support: Left upper extremity supported;Feet supported Dynamic Sitting - Level of Assistance: 4: Min assist   End of Session OT - End of Session Equipment Utilized During Treatment: Oxygen Activity Tolerance: Patient limited by fatigue Patient left: with call bell/phone within reach;in bed  GO     Galen Manila 05/24/2013, 12:53 PM

## 2013-05-24 NOTE — ED Notes (Signed)
Pt was discharged from here around 1:30 returned to facility at 4pm. Pt ambulated to her bathroom without oxygen and was found with low sats and in respiratory distress. Pt's family found her blue and laid on bed.

## 2013-05-24 NOTE — Discharge Summary (Addendum)
Physician Discharge Summary  Patricia Roberson ZOX:[id number] DOB: 1926/08/16 DOA: 05/20/2013  PCP: No PCP Per Patient  Admit date: 05/20/2013 Discharge date: 05/24/2013  Time spent: 35 minutes  Recommendations for Outpatient Follow-up:  1. Follow up with NP at facility, will need to check BP and titrate diuretics as tolerate it. b-met in 1 week. BNP    Component Value Date/Time   PROBNP 23984.0* 05/20/2013 0122   Filed Weights   05/21/13 0408 05/22/13 2100 05/23/13 0531  Weight: 82.8 kg (182 lb 8.7 oz) 86.546 kg (190 lb 12.8 oz) 85.367 kg (188 lb 3.2 oz)     Discharge Diagnoses:  Principal Problem:   Aspiration pneumonia Active Problems:   ANEMIA-IRON DEFICIENCY   COPD (chronic obstructive pulmonary disease)   Unspecified essential hypertension   Dementia with behavioral disturbance   PVD (peripheral vascular disease)   Acute respiratory failure with hypoxia   Right lower lobe pneumonia   Klebsiella UTI (lower urinary tract infection)   Dehydration   Hypokalemia   CKD (chronic kidney disease), stage III   Acute renal failure   Discharge Condition: stable  Diet recommendation: low sodium fluid restricted diet.    History of present illness:  79 years old female with PMH relevant for diastolic CHF, HTN, COPD, dementia. Resident of a SNF. Brought by EMS after collapsing and received CPR for 6 minutes for questionable cardiac arrest. At arrival breathing spontaneously on face mask but hypoxic.   Hospital Course:  Acute respiratory failure with hypoxia due to Apiration pneumonitis and COPD (chronic obstructive pulmonary disease):  - Initially hypoxic due to altered mentation  - Recent CXR with RLL opacity  - Started empirically on antibiotics, change to Azithro, augmentin. Remain Afebrile.  - will cont for additional 4 days. - COPD compensated w/o wheezing.   Severe pulmonary HTN/RHF:  - Has associated mod/severe TR with RV dilatation, by echo on 05/20/2013 unchanged  from 2011.  - I will go ahead and resume her diuretics slowly. Her echo is unchanged from 2011 she has been on Lasix and spironolactone for years and monitor a be met in the hospital. Episode of hypotension could begin to the syncopal episode she had. No decrease in preload reduction from her diuretics she has been on for over 3 years.   Acute renal failure on CKD (chronic kidney disease), stage III  - primarily 2/2 hypotension.  - Cont her Lasix as her creatinine is at baseline.  - Resume spironolactone if Her blood pressure can tolerate it.   ANEMIA-IRON DEFICIENCY  -hgb stable - colonoscopy not indicated due to advanced age   Unspecified essential hypertension  -BP soft so will hold diuretics but will cont usual low dose BB   Klebsiella UTI (lower urinary tract infection)  - Urine culture shows sensitive to penicillins, Augmentin should cover.  -blood cx's negative   Hypokalemia  -replete and follow lytes   Dementia with behavioral disturbance  -PT/OT eval - pt lives in a SNF already    Procedures:  Ct head Echo 10.15.2014: ejection fraction was in the range of 55% to 65%. Wall motion was normal  Consultations:  none  Discharge Exam: Filed Vitals:   05/24/13 0615  BP: 116/52  Pulse: 93  Temp: 98.8 F (37.1 C)  Resp: 22    General: A&O x2 Cardiovascular: RRR Respiratory: good air movement CTA B/L  Discharge Instructions      Discharge Orders   Future Orders Complete By Expires   Diet - low sodium heart  healthy  As directed    Increase activity slowly  As directed        Medication List    STOP taking these medications       nitrofurantoin (macrocrystal-monohydrate) 100 MG capsule  Commonly known as:  MACROBID      TAKE these medications       acetaminophen 325 MG tablet  Commonly known as:  TYLENOL  Take 650 mg by mouth 2 (two) times daily.     alendronate 70 MG tablet  Commonly known as:  FOSAMAX  Take 70 mg by mouth every 7 (seven) days.  Take with a full glass of water on an empty stomach. Takes on Saturday     amoxicillin-clavulanate 875-125 MG per tablet  Commonly known as:  AUGMENTIN  Take 1 tablet by mouth every 12 (twelve) hours.     atorvastatin 10 MG tablet  Commonly known as:  LIPITOR  Take 5 mg by mouth at bedtime.     azithromycin 500 MG tablet  Commonly known as:  ZITHROMAX  Take 1 tablet (500 mg total) by mouth daily.     Calcium Carb-Cholecalciferol 500-600 MG-UNIT Chew  Chew 1 tablet by mouth 2 (two) times daily with a meal.     chlorhexidine 0.12 % solution  Commonly known as:  PERIDEX  Use as directed 15 mLs in the mouth or throat 2 (two) times daily.     donepezil 5 MG tablet  Commonly known as:  ARICEPT  Take 5 mg by mouth at bedtime.     DULoxetine 60 MG capsule  Commonly known as:  CYMBALTA  Take 60 mg by mouth daily.     feeding supplement (RESOURCE BREEZE) Liqd  Take 60 mLs by mouth 2 (two) times daily.     fluticasone 50 MCG/ACT nasal spray  Commonly known as:  FLONASE  Place 2 sprays into the nose daily.     Fluticasone-Salmeterol 250-50 MCG/DOSE Aepb  Commonly known as:  ADVAIR  Inhale 1 puff into the lungs every 12 (twelve) hours.     furosemide 40 MG tablet  Commonly known as:  LASIX  Take 0.5 tablets (20 mg total) by mouth daily.     ipratropium-albuterol 0.5-2.5 (3) MG/3ML Soln  Commonly known as:  DUONEB  Take 3 mLs by nebulization every 6 (six) hours as needed (for shortness of breath).     memantine 10 MG tablet  Commonly known as:  NAMENDA  Take 10 mg by mouth 2 (two) times daily.     metoprolol tartrate 25 MG tablet  Commonly known as:  LOPRESSOR  Take 6.25 mg by mouth 2 (two) times daily.     polyethylene glycol packet  Commonly known as:  MIRALAX / GLYCOLAX  Take 17 g by mouth 2 (two) times daily.     spironolactone 50 MG tablet  Commonly known as:  ALDACTONE  Take 50 mg by mouth daily.     tiotropium 18 MCG inhalation capsule  Commonly known as:   SPIRIVA  Place 18 mcg into inhaler and inhale daily.     Vitamin D-3 1000 UNITS Caps  Take 1,000 Units by mouth daily.       Allergies  Allergen Reactions  . Codeine Other (See Comments)    unknown  . Levaquin [Levofloxacin] Other (See Comments)    unknown  . Morphine Other (See Comments)    unknown   Follow-up Information   Follow up with MAST, MAN X, NP. (Follow up with NP  at facility titrate dieretics as tolerate it. B-me tin 1 week.)    Specialty:  Nurse Practitioner   Contact information:   [address] [place] [address] [phone]        The results of significant diagnostics from this hospitalization (including imaging, microbiology, ancillary and laboratory) are listed below for reference.    Significant Diagnostic Studies: Ct Head Wo Contrast  05/20/2013   *RADIOLOGY REPORT*  Clinical Data: Loss of consciousness.  CT HEAD WITHOUT CONTRAST  Technique:  Contiguous axial images were obtained from the base of the skull through the vertex without contrast.  Comparison: CT of head March 06, 2011  Findings: The ventricles and sulci are overall normal for age.  No intraparenchymal hemorrhage, mass effect nor midline shift. Remote bilateral basal ganglial lacunar infarcts, with mild ex vacuo dilatation of the left greater than right frontal horns.  Patchy supratentorial white matter hypodensities are within normal range for patient's age and though non-specific suggest sequalae of chronic small vessel ischemic disease. No acute large vascular territory infarcts.  No abnormal extra-axial fluid collections.  Basal cisterns are patent. Moderate calcific atherosclerosis of the carotid siphons and vertebral basilar system, similar.  No skull fracture.  Visualized paranasal sinuses and mastoid aircells are well-aerated.  The included ocular globes and orbital contents are non-suspicious.  IMPRESSION: No acute intracranial process.  Stable appearance of head:  Remote  bilateral basal ganglia lacunar infarcts, mild to moderate white matter changes suggest chronic small vessel ischemic disease.   Original Report Authenticated By: Awilda Metro   Dg Chest Port 1 View  05/21/2013   CLINICAL DATA:  Shortness of breath. Infiltrates.  EXAM: PORTABLE CHEST - 1 VIEW  COMPARISON:  05/20/2013  FINDINGS: New indistinct opacity at the right base. Mild cardiomegaly, similar to prior. Unchanged upper mediastinal contours. Small bilateral pleural effusions. No overt edema. No pneumothorax.  IMPRESSION: 1. New opacity at the right base which could represent atelectasis, aspiration, or developing infection. 2. Small pleural effusions.   Electronically Signed   By: Tiburcio Pea M.D.   On: 05/21/2013 06:32   Dg Chest Portable 1 View  05/20/2013   *RADIOLOGY REPORT*  Clinical Data: Loss of consciousness, respiratory distress.  PORTABLE CHEST - 1 VIEW  Comparison: Chest radiograph March 05, 2011  Findings: Cardiac silhouette appears moderately enlarged, similar. Mildly calcified aortic knob.  Mediastinal silhouette is not suspicious.  Mild chronic interstitial changes similar with blunting of the left costophrenic angle, unchanged.  Central pulmonary vascular engorgement.  No pneumothorax though, lung apices obscured by facial structures.  Multiple EKG lines overlay the patient and could obscure underlying subtle pathology.  Soft tissue planes and included osseous structures are not suspicious.  IMPRESSION: Stable cardiomegaly and central pulmonary vasculature congestion. Mild chronic interstitial changes, blunting of the left costophrenic angle favors pleural thickening as this is similar to prior examination.   Original Report Authenticated By: Awilda Metro   Dg Swallowing Func-speech Pathology  05/22/2013   Carolan Shiver, CCC-SLP     05/22/2013  4:54 PM Objective Swallowing Evaluation: Modified Barium Swallowing Study   Patient Details  Name: TISHINA LOWN MRN: [id number]  Date of Birth: 04/09/1927  Today's Date: 05/22/2013 Time: 1350-1430 SLP Time Calculation (min): 40 min  Past Medical History:  Past Medical History  Diagnosis Date  . Depression   . Hypertension   . CHF (congestive heart failure)   . Alzheimer's disease   . Edema   . Dementia in conditions  classified elsewhere with behavioral  disturbance(294.11)    Past Surgical History: History reviewed. No pertinent past  surgical history. HPI:  79 y.o. female admitted to [facility] on 05/20/13 syncope, questionable  cardiac arrest with return to spontaneous circulation after 6  minutes. Respiratory failure due to RLL PNA (? aspiration). Note  that infiltrate was noted on f/u, not on original admission CXR.   On clinical swallow eval this am, pt presented with relatively  functional swallow, but given hx of recurrent PNAs, acute RLL  PNA, h/o COPD, and h/o dementia, instrumental swallow study was  recommended.     Assessment / Plan / Recommendation Clinical Impression  Dysphagia Diagnosis: Within Functional Limits  Clinical impression: Pt's swallow function was [facility] with noted  occasional, high penetration of thin liquids into larynx.  Thins  were ejected from larynx upon completion of swallow response.   Propulsion of material and passage through pharynx was normal.   Esophageal screen revealed barium stasis in distal esophagus with  noted backflow.    Pt was observed with lunch meal after study.  Functionally, there  were concerns not evident during MBS when pt was breathing  comfortably.   RR was in mid 30s and Sp02 fluctuated between  88-90, creating greater potential for aspiration due to  compromised timing of swallow-respiratory cycle.   Pt required  reminders to slow rate and allow rest breaks.  No overt coughing  noted, however.   Recommend continuing with regular consistency diet and thin  liquids, meds whole with liquid.   Will f/u briefly for pt/family  education re: respiration and swallowing safety.         Treatment  Recommendation  Therapy as outlined in treatment plan below    Diet Recommendation Regular;Thin liquid   Liquid Administration via: Cup;Straw Medication Administration: Whole meds with liquid Supervision: Patient able to self feed;Intermittent supervision  to cue for compensatory strategies Compensations: Slow rate;Small sips/bites (take breaks when SOB  or RR exceeds 30) Postural Changes and/or Swallow Maneuvers: Seated upright 90  degrees    Other  Recommendations Oral Care Recommendations: Oral care BID   Follow Up Recommendations  None    Frequency and Duration min 1 x/week  1 week   Pertinent Vitals/Pain No pain     General Date of Onset: 05/20/13 Type of Study: Modified Barium Swallowing Study Reason for Referral: Objectively evaluate swallowing function Previous Swallow Assessment:  (clinical swallow eval 05/22/13) Diet Prior to this Study: Dysphagia 3 (soft);Thin liquids Temperature Spikes Noted: No Respiratory Status: Nasal cannula History of Recent Intubation: No Behavior/Cognition: Alert;Cooperative;Pleasant mood Oral Cavity - Dentition: Adequate natural dentition Oral Motor / Sensory Function: Within functional limits Self-Feeding Abilities: Able to feed self Patient Positioning: Upright in bed Baseline Vocal Quality: Clear Volitional Cough: Weak Volitional Swallow: Able to elicit Anatomy: Within functional limits Pharyngeal Secretions: Not observed secondary MBS    Reason for Referral Objectively evaluate swallowing function   Oral Phase Oral Preparation/Oral Phase Oral Phase: WFL   Pharyngeal Phase Pharyngeal Phase Pharyngeal Phase: Impaired Pharyngeal - Thin Pharyngeal - Thin Straw: Penetration/Aspiration during  swallow;Premature spillage to pyriform sinuses Penetration/Aspiration details (thin straw): Material enters  airway, remains ABOVE vocal cords then ejected out  Cervical Esophageal Phase    GO    Cervical Esophageal Phase Cervical Esophageal Phase: Impaired Cervical Esophageal Phase - Comment  Cervical Esophageal Comment:  (screen of esophagus revealed  barium stasis distally)        Amanda L. Samson Frederic, MA CCC/SLP Pager  [phone]  Blenda Mounts Laurice 05/22/2013, 4:42 PM     Microbiology: Recent Results (from the past 240 hour(s))  CULTURE, BLOOD (ROUTINE X 2)     Status: None   Collection Time    05/20/13  1:30 AM      Result Value Range Status   Specimen Description BLOOD LEFT ARM   Final   Special Requests BOTTLES DRAWN AEROBIC ONLY 3.5CC   Final   Culture  Setup Time     Final   Value: 05/20/2013 09:13     Performed at [facility]   Culture     Final   Value:        BLOOD CULTURE RECEIVED NO GROWTH TO DATE CULTURE WILL BE HELD FOR 5 DAYS BEFORE ISSUING A FINAL NEGATIVE REPORT     Performed at [facility]   Report Status PENDING   Incomplete  CULTURE, BLOOD (ROUTINE X 2)     Status: None   Collection Time    05/20/13  1:55 AM      Result Value Range Status   Specimen Description BLOOD RIGHT HAND   Final   Special Requests BOTTLES DRAWN AEROBIC AND ANAEROBIC 10CC   Final   Culture  Setup Time     Final   Value: 05/20/2013 09:13     Performed at [facility]   Culture     Final   Value:        BLOOD CULTURE RECEIVED NO GROWTH TO DATE CULTURE WILL BE HELD FOR 5 DAYS BEFORE ISSUING A FINAL NEGATIVE REPORT     Performed at [facility]   Report Status PENDING   Incomplete  URINE CULTURE     Status: None   Collection Time    05/20/13  2:29 AM      Result Value Range Status   Specimen Description URINE, CATHETERIZED   Final   Special Requests ADDED 0302   Final   Culture  Setup Time     Final   Value: 05/20/2013 03:42     Performed at [facility] Count     Final   Value: >=100,000 COLONIES/ML     Performed at [facility]   Culture     Final   Value: KLEBSIELLA PNEUMONIAE     Performed at [facility]   Report Status 05/21/2013 FINAL   Final   Organism ID, Bacteria KLEBSIELLA PNEUMONIAE   Final   MRSA PCR SCREENING     Status: None   Collection Time    05/20/13  5:35 AM      Result Value Range Status   MRSA by PCR NEGATIVE  NEGATIVE Final   Comment:            The GeneXpert MRSA Assay (FDA     approved for NASAL specimens     only), is one component of a     comprehensive MRSA colonization     surveillance program. It is not     intended to diagnose MRSA     infection nor to guide or     monitor treatment for     MRSA infections.     Labs: Basic Metabolic Panel:  Recent Labs Lab 05/20/13 0155 05/20/13 0650 05/21/13 0350 05/22/13 0521 05/23/13 0615 05/24/13 0400  NA  --  137 145 141 138 136  K  --  3.5 3.8 3.3* 3.7 3.4*  CL  --  98 105 102  100 99  CO2  --  27 27 29 28 27   GLUCOSE  --  127* 87 147* 118* 94  BUN  --  41* 30* 19 18 16   CREATININE  --  1.36* 0.90 0.72 0.66 0.64  CALCIUM  --  9.1 8.6 8.8 8.3* 8.2*  MG 2.1  --   --   --   --   --    Liver Function Tests:  Recent Labs Lab 05/20/13 0122 05/22/13 0521  AST 169* 42*  ALT 207* 121*  ALKPHOS 73 72  BILITOT 1.1 0.8  PROT 6.9 6.3  ALBUMIN 3.0* 2.6*    Recent Labs Lab 05/20/13 0122  LIPASE 23   No results found for this basename: AMMONIA,  in the last 168 hours CBC:  Recent Labs Lab 05/20/13 0122 05/20/13 0650 05/21/13 0350 05/23/13 0615  WBC 31.8* 26.4* 21.2* 15.9*  NEUTROABS 28.9*  --   --   --   HGB 12.2 11.2* 11.3* 10.6*  HCT 37.2 34.5* 35.8* 33.9*  MCV 98.9 98.3 99.4 98.5  PLT 301 241 217 264   Cardiac Enzymes:  Recent Labs Lab 05/20/13 0122 05/20/13 0650 05/20/13 1015 05/20/13 1648  CKTOTAL 37  --   --   --   CKMB 3.6  --   --   --   TROPONINI  --  0.47* 0.60* 0.54*   BNP: BNP (last 3 results)  Recent Labs  05/20/13 0122  PROBNP 23984.0*   CBG:  Recent Labs Lab 05/20/13 1945 05/20/13 2349 05/21/13 0359 05/21/13 0837 05/21/13 1140  GLUCAP 75 88 87 101* 93       Signed:  FELIZ ORTIZ, Rendy Lazard  [facility] 05/24/2013, 9:28 AM

## 2013-05-24 NOTE — ED Notes (Signed)
Dr Yelverton at bedside.  

## 2013-05-24 NOTE — Progress Notes (Signed)
ANTIBIOTIC CONSULT NOTE - INITIAL  Pharmacy Consult for Vancomycin Indication: rule out pneumonia  Allergies  Allergen Reactions  . Codeine Other (See Comments)    unknown  . Levaquin [Levofloxacin] Other (See Comments)    unknown  . Morphine Other (See Comments)    unknown    Patient Measurements: Height: 5\' 4"  (162.6 cm) Weight: 186 lb 1.1 oz (84.4 kg) IBW/kg (Calculated) : 54.7 Adjusted Body Weight: 70 kg   Vital Signs: Temp: 98 F (36.7 C) (10/17 1421) Temp src: Oral (10/17 1421) BP: 111/46 mmHg (10/17 2215) Pulse Rate: 83 (10/17 2215) Intake/Output from previous day:   Intake/Output from this shift:    Labs:  Recent Labs  05/23/13 0615 05/24/13 0400 05/24/13 1005 05/24/13 1907  WBC 15.9*  --   --  17.7*  HGB 10.6*  --   --  11.2*  PLT 264  --   --  319  CREATININE 0.66 0.64 0.63 0.81   Estimated Creatinine Clearance: 54.4 ml/min (by C-G formula based on Cr of 0.81). No results found for this basename: VANCOTROUGH, VANCOPEAK, VANCORANDOM, GENTTROUGH, GENTPEAK, GENTRANDOM, TOBRATROUGH, TOBRAPEAK, TOBRARND, AMIKACINPEAK, AMIKACINTROU, AMIKACIN,  in the last 72 hours   Microbiology: Recent Results (from the past 720 hour(s))  CULTURE, BLOOD (ROUTINE X 2)     Status: None   Collection Time    05/20/13  1:30 AM      Result Value Range Status   Specimen Description BLOOD LEFT ARM   Final   Special Requests BOTTLES DRAWN AEROBIC ONLY 3.5CC   Final   Culture  Setup Time     Final   Value: 05/20/2013 09:13     Performed at [facility]   Culture     Final   Value:        BLOOD CULTURE RECEIVED NO GROWTH TO DATE CULTURE WILL BE HELD FOR 5 DAYS BEFORE ISSUING A FINAL NEGATIVE REPORT     Performed at [facility]   Report Status PENDING   Incomplete  CULTURE, BLOOD (ROUTINE X 2)     Status: None   Collection Time    05/20/13  1:55 AM      Result Value Range Status   Specimen Description BLOOD RIGHT HAND   Final   Special Requests BOTTLES  DRAWN AEROBIC AND ANAEROBIC 10CC   Final   Culture  Setup Time     Final   Value: 05/20/2013 09:13     Performed at [facility]   Culture     Final   Value:        BLOOD CULTURE RECEIVED NO GROWTH TO DATE CULTURE WILL BE HELD FOR 5 DAYS BEFORE ISSUING A FINAL NEGATIVE REPORT     Performed at [facility]   Report Status PENDING   Incomplete  URINE CULTURE     Status: None   Collection Time    05/20/13  2:29 AM      Result Value Range Status   Specimen Description URINE, CATHETERIZED   Final   Special Requests ADDED 0302   Final   Culture  Setup Time     Final   Value: 05/20/2013 03:42     Performed at [facility]   Colony Count     Final   Value: >=100,000 COLONIES/ML     Performed at [facility]   Culture     Final   Value: KLEBSIELLA PNEUMONIAE     Performed at [facility]  Report Status 05/21/2013 FINAL   Final   Organism ID, Bacteria KLEBSIELLA PNEUMONIAE   Final  MRSA PCR SCREENING     Status: None   Collection Time    05/20/13  5:35 AM      Result Value Range Status   MRSA by PCR NEGATIVE  NEGATIVE Final   Comment:            The GeneXpert MRSA Assay (FDA     approved for NASAL specimens     only), is one component of a     comprehensive MRSA colonization     surveillance program. It is not     intended to diagnose MRSA     infection nor to guide or     monitor treatment for     MRSA infections.    Medical History: Past Medical History  Diagnosis Date  . Depression   . Hypertension   . CHF (congestive heart failure)   . Alzheimer's disease   . Edema   . Dementia in conditions classified elsewhere with behavioral disturbance(294.11)     Medications:  Prescriptions prior to admission  Medication Sig Dispense Refill  . acetaminophen (TYLENOL) 325 MG tablet Take 650 mg by mouth 2 (two) times daily.      Marland Kitchen alendronate (FOSAMAX) 70 MG tablet Take 70 mg by mouth every 7 (seven) days. Take with a full glass of water  on an empty stomach. Takes on Saturday      . amoxicillin-clavulanate (AUGMENTIN) 875-125 MG per tablet Take 1 tablet by mouth every 12 (twelve) hours.      Marland Kitchen atorvastatin (LIPITOR) 10 MG tablet Take 5 mg by mouth at bedtime.      Marland Kitchen azithromycin (ZITHROMAX) 500 MG tablet Take 1 tablet (500 mg total) by mouth daily.    0  . Calcium Carb-Cholecalciferol 500-600 MG-UNIT CHEW Chew 1 tablet by mouth 2 (two) times daily with a meal.      . chlorhexidine (PERIDEX) 0.12 % solution Use as directed 15 mLs in the mouth or throat 2 (two) times daily.      . Cholecalciferol (VITAMIN D-3) 1000 UNITS CAPS Take 1,000 Units by mouth daily.      Marland Kitchen donepezil (ARICEPT) 5 MG tablet Take 5 mg by mouth at bedtime.      . DULoxetine (CYMBALTA) 60 MG capsule Take 60 mg by mouth daily.      . feeding supplement, RESOURCE BREEZE, (RESOURCE BREEZE) LIQD Take 60 mLs by mouth 2 (two) times daily.      . fluticasone (FLONASE) 50 MCG/ACT nasal spray Place 2 sprays into the nose daily.      . Fluticasone-Salmeterol (ADVAIR) 250-50 MCG/DOSE AEPB Inhale 1 puff into the lungs every 12 (twelve) hours.      . furosemide (LASIX) 40 MG tablet Take 0.5 tablets (20 mg total) by mouth daily.  30 tablet    . ipratropium-albuterol (DUONEB) 0.5-2.5 (3) MG/3ML SOLN Take 3 mLs by nebulization every 6 (six) hours as needed (for shortness of breath).      . memantine (NAMENDA) 10 MG tablet Take 10 mg by mouth 2 (two) times daily.      . metoprolol tartrate (LOPRESSOR) 25 MG tablet Take 6.25 mg by mouth 2 (two) times daily.      . polyethylene glycol (MIRALAX / GLYCOLAX) packet Take 17 g by mouth 2 (two) times daily.      Marland Kitchen spironolactone (ALDACTONE) 50 MG tablet Take 50 mg by mouth daily.      Marland Kitchen  tiotropium (SPIRIVA) 18 MCG inhalation capsule Place 18 mcg into inhaler and inhale daily.       Assessment: 79 yo female with SOB for empiric antibiotics.  Vancomycin 1 g IV given in ED at 2230  Goal of Therapy:  Vancomycin trough level 15-20  mcg/ml  Plan:  Vancomycin 1 g IV q24h  Eddie Candle 05/24/2013,11:05 PM

## 2013-05-25 DIAGNOSIS — I5032 Chronic diastolic (congestive) heart failure: Secondary | ICD-10-CM | POA: Diagnosis present

## 2013-05-25 DIAGNOSIS — F0280 Dementia in other diseases classified elsewhere without behavioral disturbance: Secondary | ICD-10-CM | POA: Diagnosis present

## 2013-05-25 DIAGNOSIS — J189 Pneumonia, unspecified organism: Secondary | ICD-10-CM | POA: Diagnosis present

## 2013-05-25 DIAGNOSIS — J9600 Acute respiratory failure, unspecified whether with hypoxia or hypercapnia: Secondary | ICD-10-CM | POA: Diagnosis present

## 2013-05-25 DIAGNOSIS — D72829 Elevated white blood cell count, unspecified: Secondary | ICD-10-CM | POA: Diagnosis present

## 2013-05-25 LAB — BASIC METABOLIC PANEL
BUN: 24 mg/dL — ABNORMAL HIGH (ref 6–23)
CO2: 26 mEq/L (ref 19–32)
Chloride: 99 mEq/L (ref 96–112)
GFR calc Af Amer: 64 mL/min — ABNORMAL LOW (ref >90)
Potassium: 4.6 mEq/L (ref 3.5–5.1)

## 2013-05-25 LAB — CBC
HCT: 34.5 % — ABNORMAL LOW (ref 36.0–46.0)
Hemoglobin: 10.8 g/dL — ABNORMAL LOW (ref 12.0–15.0)
MCHC: 31.3 g/dL (ref 30.0–36.0)
MCV: 98 fL (ref 78.0–100.0)
RDW: 18.2 % — ABNORMAL HIGH (ref 11.5–15.5)
WBC: 10.7 10*3/uL — ABNORMAL HIGH (ref 4.0–10.5)

## 2013-05-25 MED ORDER — SODIUM CHLORIDE 0.9 % IV BOLUS (SEPSIS)
500.0000 mL | Freq: Once | INTRAVENOUS | Status: AC
  Administered 2013-05-25: 500 mL via INTRAVENOUS

## 2013-05-25 MED ORDER — FUROSEMIDE 20 MG PO TABS
20.0000 mg | ORAL_TABLET | Freq: Every day | ORAL | Status: DC
  Administered 2013-05-26: 20 mg via ORAL
  Filled 2013-05-25: qty 1

## 2013-05-25 NOTE — Clinical Social Work Psychosocial (Addendum)
    Clinical Social Work Department BRIEF PSYCHOSOCIAL ASSESSMENT 05/25/2013  Patient:  Patricia Roberson, Patricia Roberson     Account Number:  [id number]     Admit date:  05/24/2013  Clinical Social Worker:  Tiburcio Pea  Date/Time:  05/21/2013 12:00 M  Referred by:  Physician  Date Referred:  05/20/2013 Referred for  SNF Placement   Other Referral:   Return to facility   Interview type:  Patient Other interview type:    PSYCHOSOCIAL DATA Living Status:  FACILITY Admitted from facility:  [facility] Level of care:  Skilled Nursing Facility Primary support name:  Patricia Roberson  [phone] Primary support relationship to patient:  CHILD, ADULT Degree of support available:   Very supportive    CURRENT CONCERNS Current Concerns  Post-Acute Placement   Other Concerns:    SOCIAL WORK ASSESSMENT / PLAN 79 year old female - resident of [facility]. CSW spoke with [place]- Admissions Director of [facility]- she stated that pateint will have a bed when medicallys table.  Patient wants to return there.  Fl2 placed on chart. CSW will faciliate d/c when medically stable.   Assessment/plan status:  Psychosocial Support/Ongoing Assessment of Needs Other assessment/ plan:   Information/referral to community resources:   None at this time    PATIENT'S/FAMILY'S RESPONSE TO PLAN OF CARE: Patient wants to return to facility when stable.  She states ok to call her daughter Patricia Roberson as needed. CSW will contact daughter and assist with d/c when stable.  Patricia Roberson. Patricia Roberson, LCSWA [phone]

## 2013-05-25 NOTE — Progress Notes (Signed)
INITIAL NUTRITION ASSESSMENT  DOCUMENTATION CODES Per approved criteria  -Obesity Unspecified   INTERVENTION: - Diet advancement per MD, recommend dysphagia 3/thin liquid diet when appropriate as pt on this diet during previous admission - Unit RD to continue to monitor   NUTRITION DIAGNOSIS: Inadequate oral intake related to inability to eat as evidenced by NPO.   Goal: Advance diet as tolerated to dysphagia 3, thin liquids  Monitor:  Weights, labs, diet advancement  Reason for Assessment: Nutrition risk   79 y.o. female  Admitting Dx: HCAP (healthcare-associated pneumonia)  ASSESSMENT: Pt admitted with shortness of breath, found cyanotic in room at SNF. Was d/c from this hospital 10/17 and readmitted later on that day. Pt asleep in room, no family present. Pt with CHF, HTN, depression, Alzheimer's disease, edema, and dementia. Per notes from past admission, pt was eating 50% of meals and on a dysphagia 3, thin liquid diet. Noted pt's weight down 5 pounds in the past 2 months.   ALT elevated but trending down.   Height: Ht Readings from Last 1 Encounters:  05/24/13 5\' 4"  (1.626 m)    Weight: Wt Readings from Last 1 Encounters:  05/24/13 186 lb 1.1 oz (84.4 kg)    Ideal Body Weight: 120 lb  % Ideal Body Weight: 155%  Wt Readings from Last 10 Encounters:  05/24/13 186 lb 1.1 oz (84.4 kg)  05/23/13 188 lb 3.2 oz (85.367 kg)  03/21/13 191 lb (86.637 kg)  05/13/09 190 lb (86.183 kg)    Usual Body Weight: 191 lb in August 2014  % Usual Body Weight: 97%  BMI:  Body mass index is 31.92 kg/(m^2). Class I obesity  Estimated Nutritional Needs: Kcal: 1914-7829 Protein: 65-75g Fluid: 1.6-1.8L/day  Skin: + 1 RLE, LLE edema, stage 2 pressure ulcer on toe  Diet Order: NPO  EDUCATION NEEDS: -No education needs identified at this time   Intake/Output Summary (Last 24 hours) at 05/25/13 1250 Last data filed at 05/25/13 0500  Gross per 24 hour  Intake     50 ml   Output      0 ml  Net     50 ml    Last BM: PTA  Labs:   Recent Labs Lab 05/20/13 0155  05/24/13 1005 05/24/13 1907 05/25/13 0358  NA  --   < > 135 140 137  K  --   < > 3.5 4.8 4.6  CL  --   < > 98 103 99  CO2  --   < > 27 25 26   BUN  --   < > 17 19 24*  CREATININE  --   < > 0.63 0.81 0.92  CALCIUM  --   < > 8.5 8.4 8.6  MG 2.1  --   --   --   --   GLUCOSE  --   < > 128* 118* 152*  < > = values in this interval not displayed.  CBG (last 3)  No results found for this basename: GLUCAP,  in the last 72 hours  Scheduled Meds: . albuterol  2.5 mg Nebulization Q6H   And  . ipratropium  0.5 mg Nebulization Q6H  . chlorhexidine  15 mL Mouth/Throat BID  . enoxaparin (LOVENOX) injection  30 mg Subcutaneous Q24H  . [START ON 05/26/2013] furosemide  20 mg Oral Daily  . piperacillin-tazobactam (ZOSYN)  IV  3.375 g Intravenous Q8H  . vancomycin  1,000 mg Intravenous Q24H    Continuous Infusions: . sodium chloride 10  mL/hr (05/25/13 0002)    Past Medical History  Diagnosis Date  . Depression   . Hypertension   . CHF (congestive heart failure)   . Alzheimer's disease   . Edema   . Dementia in conditions classified elsewhere with behavioral disturbance(294.11)     History reviewed. No pertinent past surgical history.   Levon Hedger MS, RD, LDN [phone] Weekend/After Hours Pager

## 2013-05-25 NOTE — Progress Notes (Signed)
Patient had not voided and bladder scan done noted 462 cc urine ,in and out cath as ordered and noted 650 cc of urine . Will continue to monitor .

## 2013-05-25 NOTE — Progress Notes (Signed)
Patient  Has not voided since last bladder scan , scanned again and noted 109 cc and will let on coming nurse know to watch her closely for her output

## 2013-05-25 NOTE — Progress Notes (Signed)
Pt did not void since the arrival to the floor. Bladder scan showed 200 cc.  MD was notified, orders received, NS bolus is in the process.

## 2013-05-25 NOTE — Progress Notes (Signed)
[facility] PROGRESS NOTE Assessment/Plan: Acute respiratory failure/HCAP (healthcare-associated pneumonia)/  Leukocytosis, unspecified: - On vanc and zosyn started on 10.18.2014. - She was found cyanotic without oxygen on her room by daughter. - Change to O2L nasal canula. 100% on bipap no hypercarbic on admission. - WBC lower than when she was d/c. - She is not in HF BNP is better than when she was d/c.  Chronic diastolic heart failure: - seem compensated. - resume lasix dose.  Alzheimer's disease: - cont meds.  CKD (chronic kidney disease), stage III: - at baseline.   COPD (chronic obstructive pulmonary disease) - compensated cont home meds.   Code Status: FULL CODE  Family Communication: Daughter (POA) and Son-In-Law at Bedside  Disposition Plan: Inpatient   Consultants:  none  Procedures:  CXR  Antibiotics:  Vanc & Zosyn 10.18.2014  HPI/Subjective: Sleepy,comfortable  Objective: Filed Vitals:   05/25/13 0300 05/25/13 0320 05/25/13 0448 05/25/13 0500  BP: 112/85  97/47 95/39  Pulse: 67 72 69 68  Temp:   96.8 F (36 C)   TempSrc:   Axillary   Resp: 17 16 22 16   Height:      Weight:      SpO2: 99% 100% 95% 97%    Intake/Output Summary (Last 24 hours) at 05/25/13 0714 Last data filed at 05/25/13 0500  Gross per 24 hour  Intake     50 ml  Output      0 ml  Net     50 ml   Filed Weights   05/24/13 2300  Weight: 84.4 kg (186 lb 1.1 oz)    Exam:  General: Alert, awake, oriented x1, in no acute distress.  HEENT: No bruits, no goiter.  Heart: Regular rate and rhythm, without murmurs, rubs, gallops.  Lungs: Good air movement, clear to auscultation. Abdomen: Soft, nontender, nondistended, positive bowel sounds.    Data Reviewed: Basic Metabolic Panel:  Recent Labs Lab 05/20/13 0155  05/23/13 0615 05/24/13 0400 05/24/13 1005 05/24/13 1907 05/25/13 0358  NA  --   < > 138 136 135 140 137  K  --   < > 3.7 3.4* 3.5 4.8 4.6  CL  --    < > 100 99 98 103 99  CO2  --   < > 28 27 27 25 26   GLUCOSE  --   < > 118* 94 128* 118* 152*  BUN  --   < > 18 16 17 19  24*  CREATININE  --   < > 0.66 0.64 0.63 0.81 0.92  CALCIUM  --   < > 8.3* 8.2* 8.5 8.4 8.6  MG 2.1  --   --   --   --   --   --   < > = values in this interval not displayed. Liver Function Tests:  Recent Labs Lab 05/20/13 0122 05/22/13 0521 05/24/13 1907  AST 169* 42* 17  ALT 207* 121* 56*  ALKPHOS 73 72 66  BILITOT 1.1 0.8 0.8  PROT 6.9 6.3 6.5  ALBUMIN 3.0* 2.6* 2.5*    Recent Labs Lab 05/20/13 0122  LIPASE 23   No results found for this basename: AMMONIA,  in the last 168 hours CBC:  Recent Labs Lab 05/20/13 0122 05/20/13 0650 05/21/13 0350 05/23/13 0615 05/24/13 1907 05/25/13 0358  WBC 31.8* 26.4* 21.2* 15.9* 17.7* 10.7*  NEUTROABS 28.9*  --   --   --  14.5*  --   HGB 12.2 11.2* 11.3* 10.6* 11.2* 10.8*  HCT  37.2 34.5* 35.8* 33.9* 36.3 34.5*  MCV 98.9 98.3 99.4 98.5 98.4 98.0  PLT 301 241 217 264 319 254   Cardiac Enzymes:  Recent Labs Lab 05/20/13 0122 05/20/13 0650 05/20/13 1015 05/20/13 1648 05/24/13 1907  CKTOTAL 37  --   --   --   --   CKMB 3.6  --   --   --   --   TROPONINI  --  0.47* 0.60* 0.54* <0.30   BNP (last 3 results)  Recent Labs  05/20/13 0122 05/24/13 1907  PROBNP 23984.0* 12808.0*   CBG:  Recent Labs Lab 05/20/13 1945 05/20/13 2349 05/21/13 0359 05/21/13 0837 05/21/13 1140  GLUCAP 75 88 87 101* 93    Recent Results (from the past 240 hour(s))  CULTURE, BLOOD (ROUTINE X 2)     Status: None   Collection Time    05/20/13  1:30 AM      Result Value Range Status   Specimen Description BLOOD LEFT ARM   Final   Special Requests BOTTLES DRAWN AEROBIC ONLY 3.5CC   Final   Culture  Setup Time     Final   Value: 05/20/2013 09:13     Performed at [facility]   Culture     Final   Value:        BLOOD CULTURE RECEIVED NO GROWTH TO DATE CULTURE WILL BE HELD FOR 5 DAYS BEFORE ISSUING A FINAL  NEGATIVE REPORT     Performed at [facility]   Report Status PENDING   Incomplete  CULTURE, BLOOD (ROUTINE X 2)     Status: None   Collection Time    05/20/13  1:55 AM      Result Value Range Status   Specimen Description BLOOD RIGHT HAND   Final   Special Requests BOTTLES DRAWN AEROBIC AND ANAEROBIC 10CC   Final   Culture  Setup Time     Final   Value: 05/20/2013 09:13     Performed at [facility]   Culture     Final   Value:        BLOOD CULTURE RECEIVED NO GROWTH TO DATE CULTURE WILL BE HELD FOR 5 DAYS BEFORE ISSUING A FINAL NEGATIVE REPORT     Performed at [facility]   Report Status PENDING   Incomplete  URINE CULTURE     Status: None   Collection Time    05/20/13  2:29 AM      Result Value Range Status   Specimen Description URINE, CATHETERIZED   Final   Special Requests ADDED 0302   Final   Culture  Setup Time     Final   Value: 05/20/2013 03:42     Performed at [facility] Count     Final   Value: >=100,000 COLONIES/ML     Performed at [facility]   Culture     Final   Value: KLEBSIELLA PNEUMONIAE     Performed at [facility]   Report Status 05/21/2013 FINAL   Final   Organism ID, Bacteria KLEBSIELLA PNEUMONIAE   Final  MRSA PCR SCREENING     Status: None   Collection Time    05/20/13  5:35 AM      Result Value Range Status   MRSA by PCR NEGATIVE  NEGATIVE Final   Comment:            The GeneXpert MRSA Assay (FDA     approved  for NASAL specimens     only), is one component of a     comprehensive MRSA colonization     surveillance program. It is not     intended to diagnose MRSA     infection nor to guide or     monitor treatment for     MRSA infections.     Studies: Dg Chest Port 1 View  05/24/2013   CLINICAL DATA:  Shortness of breath.  EXAM: PORTABLE CHEST - 1 VIEW  COMPARISON:  05/21/2013.  FINDINGS: The heart is mildly enlarged but stable. The mediastinal and hilar contours are unchanged.  Persistent right basilar opacity could reflect atelectasis or pneumonia. There are small bilateral pleural effusions.  IMPRESSION: Persistent right basilar opacity and small effusions.   Electronically Signed   By: Loralie Champagne M.D.   On: 05/24/2013 19:56    Scheduled Meds: . albuterol  2.5 mg Nebulization Q6H   And  . ipratropium  0.5 mg Nebulization Q6H  . chlorhexidine  15 mL Mouth/Throat BID  . enoxaparin (LOVENOX) injection  30 mg Subcutaneous Q24H  . furosemide  20 mg Intravenous BID  . piperacillin-tazobactam (ZOSYN)  IV  3.375 g Intravenous Q8H  . vancomycin  1,000 mg Intravenous Q24H   Continuous Infusions: . sodium chloride 10 mL/hr (05/25/13 0002)     Radonna Ricker Rosine Beat  [facility] Pager [phone]. If 8PM-8AM, please contact night-coverage at [email or web address], password [facility] 05/25/2013, 7:14 AM  LOS: 1 day

## 2013-05-25 NOTE — Progress Notes (Signed)
Ok per MD for d/c today back to [facility]. Ok per patient her daughter Harriett Sine.  EMS to transport. Notified Janie at [facility]- bed is available for patient's return.  Nursing notified to call report.  CSW signing off.  Lorri Frederick. [place]  [phone]

## 2013-05-26 LAB — CULTURE, BLOOD (ROUTINE X 2): Culture: NO GROWTH

## 2013-05-26 MED ORDER — MEMANTINE HCL 10 MG PO TABS
10.0000 mg | ORAL_TABLET | Freq: Two times a day (BID) | ORAL | Status: DC
  Administered 2013-05-26 – 2013-05-27 (×4): 10 mg via ORAL
  Filled 2013-05-26 (×6): qty 1

## 2013-05-26 MED ORDER — DONEPEZIL HCL 5 MG PO TABS
5.0000 mg | ORAL_TABLET | Freq: Every day | ORAL | Status: DC
  Administered 2013-05-26 – 2013-05-27 (×2): 5 mg via ORAL
  Filled 2013-05-26 (×3): qty 1

## 2013-05-26 MED ORDER — DULOXETINE HCL 60 MG PO CPEP
60.0000 mg | ORAL_CAPSULE | Freq: Every day | ORAL | Status: DC
  Administered 2013-05-26 – 2013-05-27 (×2): 60 mg via ORAL
  Filled 2013-05-26 (×3): qty 1

## 2013-05-26 MED ORDER — MOMETASONE FURO-FORMOTEROL FUM 100-5 MCG/ACT IN AERO
2.0000 | INHALATION_SPRAY | Freq: Two times a day (BID) | RESPIRATORY_TRACT | Status: DC
  Administered 2013-05-26 – 2013-05-28 (×5): 2 via RESPIRATORY_TRACT
  Filled 2013-05-26: qty 8.8

## 2013-05-26 MED ORDER — POLYETHYLENE GLYCOL 3350 17 G PO PACK
17.0000 g | PACK | Freq: Two times a day (BID) | ORAL | Status: DC
  Administered 2013-05-26 – 2013-05-27 (×2): 17 g via ORAL
  Filled 2013-05-26 (×6): qty 1

## 2013-05-26 MED ORDER — TIOTROPIUM BROMIDE MONOHYDRATE 18 MCG IN CAPS
18.0000 ug | ORAL_CAPSULE | Freq: Every day | RESPIRATORY_TRACT | Status: DC
  Administered 2013-05-26 – 2013-05-28 (×3): 18 ug via RESPIRATORY_TRACT
  Filled 2013-05-26: qty 5

## 2013-05-26 MED ORDER — ENOXAPARIN SODIUM 40 MG/0.4ML ~~LOC~~ SOLN
40.0000 mg | SUBCUTANEOUS | Status: DC
  Administered 2013-05-26 – 2013-05-27 (×2): 40 mg via SUBCUTANEOUS
  Filled 2013-05-26 (×3): qty 0.4

## 2013-05-26 MED ORDER — ALBUTEROL SULFATE (5 MG/ML) 0.5% IN NEBU: 2.5000 mg | INHALATION_SOLUTION | RESPIRATORY_TRACT | Status: DC | PRN

## 2013-05-26 MED ORDER — METOPROLOL TARTRATE 25 MG PO TABS: 6.2500 mg | ORAL_TABLET | Freq: Two times a day (BID) | ORAL | Status: DC

## 2013-05-26 MED ORDER — ATORVASTATIN CALCIUM 10 MG PO TABS
5.0000 mg | ORAL_TABLET | Freq: Every day | ORAL | Status: DC
  Administered 2013-05-26 – 2013-05-27 (×2): 5 mg via ORAL
  Filled 2013-05-26 (×3): qty 0.5

## 2013-05-26 MED ORDER — FLUTICASONE PROPIONATE 50 MCG/ACT NA SUSP
2.0000 | Freq: Every day | NASAL | Status: DC
  Administered 2013-05-26 – 2013-05-27 (×2): 2 via NASAL
  Filled 2013-05-26: qty 16

## 2013-05-26 NOTE — Progress Notes (Signed)
[facility] Progress Note [place] TEAM 1 - Stepdown/ICU TEAM   Patricia Roberson DGU:[id number] DOB: Jul 14, 1927 DOA: 05/24/2013 PCP: No PCP Per Patient  Admit HPI / Brief Narrative: 79 y.o. female resident of [facility] who returned from the hospital to the facility and was found an hour later cyanotic and tachypneic in her room. The staff placed her on [place] O2, and found her O2 saturations in the 70's. Her daughter was at the bedside and requested that her mother be taken by EMS back to the hospital. In the ED, she was placed on BIPAP.   She was hospitalized 1 week ago for similar symptoms and was treated for sepsis, UTI, pneumonia vs/ aspiration pneumonitis, and CHF. She had been treated at the SNF for RLL pneumonia due to aspiration 3 weeks previously.  She was discharged from the hospital on Augmentin and Azithromycin.   Assessment/Plan:  Acute respiratory failure with hypoxia due to:  was found in stool and cyanotic without oxygen on in her room at the SNF by daughter, who prompted immediate return to hospital     A)  Severe pulmonary HTN (PA pressure 95mm Hg / RHF / Severe Triscuspid regurgitation  This is likely the primary cause of her decompensation, in conjunction with anxiety provoked by hypoxia - she is fully O2 dependent given the severity of her pulm HTN - untreatable nature of this problem discussed w/ daughter/POA, who agrees with a Palliative Care consult for goals of care and assistance w/ formulating disposition (would pt qualify for a hospice home?) - PA pressure via TTE 2011 reported as normal      B)  Right lower lobe pneumonia vs/ pneumonitis  Will complete intended course of abx tx, but there is no indication that her PNA worsened or lead to her decompensation - she is afebrile, and her WBC count is declining      C)  COPD Well compensated at this time  CKD stage III  Renal function is stable with GFR ~60  ANEMIA-IRON DEFICIENCY  -hgb stable -  colonoscopy not indicated due to advanced age   Unspecified essential hypertension  Would NOT use diuretics in the pt with severe R heart failure unless she is clearly signif volume overloaded as she will be preload dependent and prone to hypotension with DH - of note, her PA pressure via TTE was noted to be normal in 2011, and is now severely elevated, indicating a marked progression in her disease   Recent Klebsiella UTI (lower urinary tract infection)  Monitor for signs of recrudescence  Alzheimer's disease No agitation at this time   PVD  Code Status: FULL Family Communication: Discussed situation in treatment plan at length with daughter/power of attorney and son-in-law Disposition Plan: SDU  Consultants: Palliative care  Procedures: none  Antibiotics: Vanc 10/17 >> Zosyn 10/17 >>  DVT prophylaxis: lovenox  HPI/Subjective: The patient is alert and interactive.  She appears comfortable.  She denies any specific complaints at this time.  Objective: Blood pressure 116/48, pulse 81, temperature 97.9 F (36.6 C), temperature source Oral, resp. rate 19, height 5\' 4"  (1.626 m), weight 84.4 kg (186 lb 1.1 oz), SpO2 97.00%.  Intake/Output Summary (Last 24 hours) at 05/26/13 0930 Last data filed at 05/26/13 0641  Gross per 24 hour  Intake     50 ml  Output   2300 ml  Net  -2250 ml   Exam: General: No acute respiratory distress at present  Lungs: Fine crackles  throughout all fields with no wheeze Cardiovascular: Regular rate and rhythm without murmur gallop or rub normal S1 and S2 Abdomen: Obese, nontender, nondistended, soft, bowel sounds positive, no rebound, no ascites, no appreciable mass Extremities: No significant cyanosis, clubbing, or edema bilateral lower extremities  Data Reviewed: Basic Metabolic Panel:  Recent Labs Lab 05/20/13 0155  05/23/13 0615 05/24/13 0400 05/24/13 1005 05/24/13 1907 05/25/13 0358  NA  --   < > 138 136 135 140 137  K  --   < >  3.7 3.4* 3.5 4.8 4.6  CL  --   < > 100 99 98 103 99  CO2  --   < > 28 27 27 25 26   GLUCOSE  --   < > 118* 94 128* 118* 152*  BUN  --   < > 18 16 17 19  24*  CREATININE  --   < > 0.66 0.64 0.63 0.81 0.92  CALCIUM  --   < > 8.3* 8.2* 8.5 8.4 8.6  MG 2.1  --   --   --   --   --   --   < > = values in this interval not displayed.  Liver Function Tests:  Recent Labs Lab 05/20/13 0122 05/22/13 0521 05/24/13 1907  AST 169* 42* 17  ALT 207* 121* 56*  ALKPHOS 73 72 66  BILITOT 1.1 0.8 0.8  PROT 6.9 6.3 6.5  ALBUMIN 3.0* 2.6* 2.5*    Recent Labs Lab 05/20/13 0122  LIPASE 23   CBC:  Recent Labs Lab 05/20/13 0122 05/20/13 0650 05/21/13 0350 05/23/13 0615 05/24/13 1907 05/25/13 0358  WBC 31.8* 26.4* 21.2* 15.9* 17.7* 10.7*  NEUTROABS 28.9*  --   --   --  14.5*  --   HGB 12.2 11.2* 11.3* 10.6* 11.2* 10.8*  HCT 37.2 34.5* 35.8* 33.9* 36.3 34.5*  MCV 98.9 98.3 99.4 98.5 98.4 98.0  PLT 301 241 217 264 319 254   Cardiac Enzymes:  Recent Labs Lab 05/20/13 0122 05/20/13 0650 05/20/13 1015 05/20/13 1648 05/24/13 1907  CKTOTAL 37  --   --   --   --   CKMB 3.6  --   --   --   --   TROPONINI  --  0.47* 0.60* 0.54* <0.30   BNP (last 3 results)  Recent Labs  05/20/13 0122 05/24/13 1907  PROBNP 23984.0* 12808.0*   CBG:  Recent Labs Lab 05/20/13 2349 05/21/13 0359 05/21/13 0837 05/21/13 1140 05/26/13 0828  GLUCAP 88 87 101* 93 127*    Recent Results (from the past 240 hour(s))  CULTURE, BLOOD (ROUTINE X 2)     Status: None   Collection Time    05/20/13  1:30 AM      Result Value Range Status   Specimen Description BLOOD LEFT ARM   Final   Special Requests BOTTLES DRAWN AEROBIC ONLY 3.5CC   Final   Culture  Setup Time     Final   Value: 05/20/2013 09:13     Performed at [facility]   Culture     Final   Value:        BLOOD CULTURE RECEIVED NO GROWTH TO DATE CULTURE WILL BE HELD FOR 5 DAYS BEFORE ISSUING A FINAL NEGATIVE REPORT     Performed at  [facility]   Report Status PENDING   Incomplete  CULTURE, BLOOD (ROUTINE X 2)     Status: None   Collection Time    05/20/13  1:55  AM      Result Value Range Status   Specimen Description BLOOD RIGHT HAND   Final   Special Requests BOTTLES DRAWN AEROBIC AND ANAEROBIC 10CC   Final   Culture  Setup Time     Final   Value: 05/20/2013 09:13     Performed at [facility]   Culture     Final   Value:        BLOOD CULTURE RECEIVED NO GROWTH TO DATE CULTURE WILL BE HELD FOR 5 DAYS BEFORE ISSUING A FINAL NEGATIVE REPORT     Performed at [facility]   Report Status PENDING   Incomplete  URINE CULTURE     Status: None   Collection Time    05/20/13  2:29 AM      Result Value Range Status   Specimen Description URINE, CATHETERIZED   Final   Special Requests ADDED 0302   Final   Culture  Setup Time     Final   Value: 05/20/2013 03:42     Performed at [facility] Count     Final   Value: >=100,000 COLONIES/ML     Performed at [facility]   Culture     Final   Value: KLEBSIELLA PNEUMONIAE     Performed at [facility]   Report Status 05/21/2013 FINAL   Final   Organism ID, Bacteria KLEBSIELLA PNEUMONIAE   Final  MRSA PCR SCREENING     Status: None   Collection Time    05/20/13  5:35 AM      Result Value Range Status   MRSA by PCR NEGATIVE  NEGATIVE Final   Comment:            The GeneXpert MRSA Assay (FDA     approved for NASAL specimens     only), is one component of a     comprehensive MRSA colonization     surveillance program. It is not     intended to diagnose MRSA     infection nor to guide or     monitor treatment for     MRSA infections.     Studies:  Recent x-ray studies have been reviewed in detail by the Attending Physician  Scheduled Meds:  Scheduled Meds: . albuterol  2.5 mg Nebulization Q6H   And  . ipratropium  0.5 mg Nebulization Q6H  . chlorhexidine  15 mL Mouth/Throat BID  . enoxaparin  (LOVENOX) injection  40 mg Subcutaneous Q24H  . furosemide  20 mg Oral Daily  . piperacillin-tazobactam (ZOSYN)  IV  3.375 g Intravenous Q8H  . vancomycin  1,000 mg Intravenous Q24H    Time spent on care of this patient: 35 mins   [facility] T  [facility] Office  [phone] Pager - Text Page per Loretha Stapler as per below:  On-Call/Text Page:      [email or web address]      password [email or web address]  If 7PM-7AM, please contact night-coverage [email or web address] Password [email or web address] 05/26/2013, 9:30 AM   LOS: 2 days

## 2013-05-26 NOTE — Progress Notes (Signed)
Thank you for consulting the Palliative Medicine Team at [facility] to meet your patient's and family's needs.   The reason that you asked [place] to see your patient is  For GOC  We have scheduled your patient for a meeting: 10/20 at 830 am  The Surrogate decision make is: Daughter , Ms. Brown Contact information:[phone]  Other family members that need to be present: at their discretion    Your patient is able/unable to participate: +/-  Additional Narrative:  Patient is from [facility]

## 2013-05-27 DIAGNOSIS — Z515 Encounter for palliative care: Secondary | ICD-10-CM

## 2013-05-27 NOTE — Clinical Social Work Note (Addendum)
CSW notified by [facility] that pt's family's first choices are [facility] and [facility] residential hospice. CSW has sent pt's clinicals to these facilities and has also sent clinicals to all SNFs in [place] and [place] counties. CSW awaiting bed offers and will present to family when available.    Maryclare Labrador, MSW, [facility] Clinical Social Worker [phone]

## 2013-05-27 NOTE — Clinical Social Work Note (Addendum)
05/27/13, 4:15pm CSW called [facility]osp[facility] at [facility] to see if they have had a chance to review pt's paperwork. Admissions rep Delray Alt has looked over clinicals and has spoken with pt's daughter Harriett Sine. Harriett Sine will call facility tomorrow morning to schedule a tour and have pt assessed by [facility]. CSW will call Harriett Sine tomorrow to check in with her concerning this plan.   CSW spoke over the phone with pt's daughter Harriett Sine. Harriett Sine requested CSW send clinicals to [facility] (no beds available) and [facility] at [facility]. CSW has faxed clinicals to [facility] at [facility] and is awaiting a response. CSW has updated pt's daughter concerning no bed at [facility] and that CSW is waiting to hear from the residential hospice facility.   Maryclare Labrador, MSW, [facility] Clinical Social Worker [phone]

## 2013-05-27 NOTE — Consult Note (Signed)
Patient ID:Patricia Roberson      DOB: 1927-04-07      ZOX:[id number]   Summary of Goals of care full note to follow:  Met with Daughter Patricia Roberson and Son- in -law and patient who is very guarded   Family was able to tell their story related to [facility] recent hospital stay and discharge.  Patricia Roberson was able to admit to me that [name] likely could not and would not want to participate in end of life planning.  When approached [name] was not able to articulate her history or her needs.  She was some what guarded which likely covers her underlying dementia.  We, therefore,  Determined that goals would be best determined by her daughter.  In light of  [name]'s significant comorbid disease , I believe that residential hospice is an appropriate next step for this patient. Her family is excepting limitations of care to general comfort with a change to DNR.  Her pulmonary htn and copd with recent  Infection and bacteremia have caused her to decline .    Recommend:   1.  DNR  2.  Residential placement  3.  COPD exacerbation : continue aggressive pulmonary toilet , and antibiotics which could be completed as po antibotics.   Total time:  840 am- 1005 am  Patricia Ziegler L. Ladona Ridgel, MD MBA The Palliative Medicine Team at [facility] Phone: [phone] Pager: [phone]

## 2013-05-27 NOTE — Progress Notes (Addendum)
[facility] Progress Note [place] TEAM 1 - Stepdown/ICU TEAM   Patricia Roberson ZOX:[id number] DOB: 1927/06/29 DOA: 05/24/2013 PCP: No PCP Per Patient  Admit HPI / Brief Narrative: 79 y.o. female resident of [facility] who returned from the hospital to the facility and was found an hour later cyanotic and tachypneic in her room. The staff placed her on [place] O2, and found her O2 saturations in the 70's. Her daughter was at the bedside and requested that her mother be taken by EMS back to the hospital. In the ED, she was placed on BIPAP.   She was hospitalized 1 week ago for similar symptoms and was treated for sepsis, UTI, pneumonia vs/ aspiration pneumonitis, and CHF. She had been treated at the SNF for RLL pneumonia due to aspiration 3 weeks previously.  She was discharged from the hospital on Augmentin and Azithromycin.   Assessment/Plan:  Acute respiratory failure with hypoxia due to:  was found in stool and cyanotic without oxygen on in her room at the SNF by daughter, who prompted immediate return to hospital     A)  Severe pulmonary HTN (PA pressure 95mm Hg / RHF / Severe Triscuspid regurgitation  This is likely the primary cause of her decompensation, in conjunction with anxiety provoked by hypoxia - she is fully O2 dependent given the severity of her pulm HTN - the untreatable nature of this problem was discussed w/ daughter/POA, who agrees with a Palliative Care consult - Palliative Care has now seen the patient and all parties agree with transfer to a residential hospice home is in the patient's best interest - a bed is being sought at this time      B)  Right lower lobe pneumonia vs/ pneumonitis  Will complete intended course of abx tx, but there is no indication that her PNA worsened or lead to her decompensation - she is afebrile, and her WBC count is declining      C)  COPD Well compensated at this time  CKD stage III  Renal function is stable with GFR  ~60  ANEMIA-IRON DEFICIENCY  -hgb stable   Unspecified essential hypertension  Would NOT use diuretics in the pt with severe R heart failure unless she is clearly signif volume overloaded as she will be preload dependent and prone to hypotension with DH - of note, her PA pressure via TTE was noted to be normal in 2011, and is now severely elevated, indicating a marked progression in her disease   Recent Klebsiella UTI (lower urinary tract infection)  Monitor for signs of recrudescence  Alzheimer's disease No agitation at this time   PVD  Code Status: FULL Family Communication: No family present at time of exam today - family met at length with palliative care today Disposition Plan: SDU - awaiting bed in residential hospice facility  Consultants: Palliative care  Procedures: none  Antibiotics: Vanc 10/17 >>10/19 Zosyn 10/17 >>10/19  DVT prophylaxis: lovenox  HPI/Subjective: The patient is alert and interactive.  She appears much more comfortable today.  She denies any specific complaints.  Objective: Blood pressure 113/57, pulse 80, temperature 97.6 F (36.4 C), temperature source Oral, resp. rate 19, height 5\' 4"  (1.626 m), weight 84.4 kg (186 lb 1.1 oz), SpO2 97.00%.  Intake/Output Summary (Last 24 hours) at 05/27/13 1544 Last data filed at 05/27/13 0700  Gross per 24 hour  Intake    500 ml  Output    400 ml  Net  100 ml   Exam: General: No acute respiratory distress at present  Lungs: Fine crackles throughout all fields with no wheeze Cardiovascular: Regular rate and rhythm without murmur gallop or rub  Abdomen: Obese, nontender, nondistended, soft, bowel sounds positive, no rebound, no ascites, no appreciable mass Extremities: No significant cyanosis, clubbing, or edema bilateral lower extremities  Data Reviewed: Basic Metabolic Panel:  Recent Labs Lab 05/23/13 0615 05/24/13 0400 05/24/13 1005 05/24/13 1907 05/25/13 0358  NA 138 136 135 140 137   K 3.7 3.4* 3.5 4.8 4.6  CL 100 99 98 103 99  CO2 28 27 27 25 26   GLUCOSE 118* 94 128* 118* 152*  BUN 18 16 17 19  24*  CREATININE 0.66 0.64 0.63 0.81 0.92  CALCIUM 8.3* 8.2* 8.5 8.4 8.6    Liver Function Tests:  Recent Labs Lab 05/22/13 0521 05/24/13 1907  AST 42* 17  ALT 121* 56*  ALKPHOS 72 66  BILITOT 0.8 0.8  PROT 6.3 6.5  ALBUMIN 2.6* 2.5*   CBC:  Recent Labs Lab 05/21/13 0350 05/23/13 0615 05/24/13 1907 05/25/13 0358  WBC 21.2* 15.9* 17.7* 10.7*  NEUTROABS  --   --  14.5*  --   HGB 11.3* 10.6* 11.2* 10.8*  HCT 35.8* 33.9* 36.3 34.5*  MCV 99.4 98.5 98.4 98.0  PLT 217 264 319 254   Cardiac Enzymes:  Recent Labs Lab 05/20/13 1648 05/24/13 1907  TROPONINI 0.54* <0.30   BNP (last 3 results)  Recent Labs  05/20/13 0122 05/24/13 1907  PROBNP 16109.6* 12808.0*   CBG:  Recent Labs Lab 05/20/13 2349 05/21/13 0359 05/21/13 0837 05/21/13 1140 05/26/13 0828  GLUCAP 88 87 101* 93 127*    Recent Results (from the past 240 hour(s))  CULTURE, BLOOD (ROUTINE X 2)     Status: None   Collection Time    05/20/13  1:30 AM      Result Value Range Status   Specimen Description BLOOD LEFT ARM   Final   Special Requests BOTTLES DRAWN AEROBIC ONLY 3.5CC   Final   Culture  Setup Time     Final   Value: 05/20/2013 09:13     Performed at [facility]   Culture     Final   Value: NO GROWTH 5 DAYS     Performed at [facility]   Report Status 05/26/2013 FINAL   Final  CULTURE, BLOOD (ROUTINE X 2)     Status: None   Collection Time    05/20/13  1:55 AM      Result Value Range Status   Specimen Description BLOOD RIGHT HAND   Final   Special Requests BOTTLES DRAWN AEROBIC AND ANAEROBIC 10CC   Final   Culture  Setup Time     Final   Value: 05/20/2013 09:13     Performed at [facility]   Culture     Final   Value: NO GROWTH 5 DAYS     Performed at [facility]   Report Status 05/26/2013 FINAL   Final  URINE CULTURE      Status: None   Collection Time    05/20/13  2:29 AM      Result Value Range Status   Specimen Description URINE, CATHETERIZED   Final   Special Requests ADDED 0454   Final   Culture  Setup Time     Final   Value: 05/20/2013 03:42     Performed at [facility] Count  Final   Value: >=100,000 COLONIES/ML     Performed at [facility]   Culture     Final   Value: KLEBSIELLA PNEUMONIAE     Performed at [facility]   Report Status 05/21/2013 FINAL   Final   Organism ID, Bacteria KLEBSIELLA PNEUMONIAE   Final  MRSA PCR SCREENING     Status: None   Collection Time    05/20/13  5:35 AM      Result Value Range Status   MRSA by PCR NEGATIVE  NEGATIVE Final   Comment:            The GeneXpert MRSA Assay (FDA     approved for NASAL specimens     only), is one component of a     comprehensive MRSA colonization     surveillance program. It is not     intended to diagnose MRSA     infection nor to guide or     monitor treatment for     MRSA infections.     Studies:  Recent x-ray studies have been reviewed in detail by the Attending Physician  Scheduled Meds:  Scheduled Meds: . atorvastatin  5 mg Oral QHS  . chlorhexidine  15 mL Mouth/Throat BID  . donepezil  5 mg Oral QHS  . DULoxetine  60 mg Oral Daily  . enoxaparin (LOVENOX) injection  40 mg Subcutaneous Q24H  . fluticasone  2 spray Each Nare Daily  . memantine  10 mg Oral BID  . mometasone-formoterol  2 puff Inhalation BID  . polyethylene glycol  17 g Oral BID  . tiotropium  18 mcg Inhalation Daily    Time spent on care of this patient: 25 mins   [facility] T  [facility] Office  [phone] Pager - Text Page per Loretha Stapler as per below:  On-Call/Text Page:      [email or web address]      password [email or web address]  If 7PM-7AM, please contact night-coverage [email or web address] Password [email or web address] 05/27/2013, 3:44 PM   LOS: 3 days

## 2013-05-27 NOTE — Progress Notes (Signed)
Utilization Review Completed.  

## 2013-05-27 NOTE — Clinical Social Work Note (Signed)
CSW received a call from Forrestine Him, rep with [facility]. Carley Hammed is going to assess pt to determine if pt can be admitted to [facility]. CSW standing by.    Maryclare Labrador, MSW, [facility] Clinical Social Worker [phone]

## 2013-05-28 DIAGNOSIS — A0472 Enterocolitis due to Clostridium difficile, not specified as recurrent: Secondary | ICD-10-CM | POA: Diagnosis not present

## 2013-05-28 MED ORDER — LORAZEPAM 2 MG/ML PO CONC: 0.5000 mg | ORAL | Status: DC | PRN

## 2013-05-28 MED ORDER — ALBUTEROL SULFATE (5 MG/ML) 0.5% IN NEBU: 2.5000 mg | INHALATION_SOLUTION | RESPIRATORY_TRACT | Status: AC | PRN

## 2013-05-28 MED ORDER — METRONIDAZOLE 500 MG PO TABS: 500.0000 mg | ORAL_TABLET | Freq: Three times a day (TID) | ORAL | Status: AC

## 2013-05-28 MED ORDER — ONDANSETRON HCL 4 MG PO TABS: 4.0000 mg | ORAL_TABLET | Freq: Four times a day (QID) | ORAL | Status: AC | PRN

## 2013-05-28 MED ORDER — ACETAMINOPHEN 650 MG RE SUPP: 650.0000 mg | Freq: Four times a day (QID) | RECTAL | Status: AC | PRN

## 2013-05-28 MED ORDER — METRONIDAZOLE 500 MG PO TABS
500.0000 mg | ORAL_TABLET | Freq: Three times a day (TID) | ORAL | Status: DC
  Administered 2013-05-28: 500 mg via ORAL
  Filled 2013-05-28 (×4): qty 1

## 2013-05-28 MED ORDER — ACETAMINOPHEN 325 MG PO TABS: 650.0000 mg | ORAL_TABLET | Freq: Four times a day (QID) | ORAL | Status: AC | PRN

## 2013-05-28 MED ORDER — ALUM & MAG HYDROXIDE-SIMETH 200-200-20 MG/5ML PO SUSP: 30.0000 mL | Freq: Four times a day (QID) | ORAL | Status: AC | PRN

## 2013-05-28 MED ORDER — LORAZEPAM 0.5 MG PO TABS
0.5000 mg | ORAL_TABLET | ORAL | Status: DC | PRN
  Administered 2013-05-28: 1 mg via ORAL
  Filled 2013-05-28: qty 2

## 2013-05-28 NOTE — Discharge Summary (Signed)
Physician Discharge Summary  Patricia Roberson:[id number] DOB: 01/22/1927 DOA: 05/24/2013  PCP: No PCP Per Patient  Admit date: 05/24/2013 Discharge date: 05/28/2013  Time spent: 30 minutes  Recommendations for Outpatient Follow-up:  1. Discharge to [facility] for continued Hospice care  Discharge Diagnoses:  Principal Problem:    Acute respiratory failure due to HCAP (healthcare-associated pneumonia)-resolving Active Problems:   COPD (chronic obstructive pulmonary disease)/Severe pulmonary HTN (95 mmHg)   C. Difficile colitis   CKD (chronic kidney disease), stage III   ANEMIA-IRON DEFICIENCY   Unspecified vitamin D deficiency   Hyperlipidemia   Leukocytosis-resolving   Alzheimer's disease   Chronic diastolic heart failure   Discharge Condition: guarded  Diet recommendation: Dysphagia 3 with thin liquids  Filed Weights   05/24/13 2300  Weight: 84.4 kg (186 lb 1.1 oz)    History of present illness:  79 y.o. female resident of [facility] who returned from the hospital to the facility and was found an hour later cyanotic and tachypneic in her room. The staff placed her on [place] O2, and found her O2 saturations in the 70's. Her daughter was at the bedside and requested that her mother be taken by EMS back to the hospital. In the ED, she was placed on BIPAP.   She was hospitalized 1 week ago for similar symptoms and was treated for sepsis, UTI, pneumonia vs/ aspiration pneumonitis, and CHF. She had been treated at the SNF for RLL pneumonia due to aspiration 3 weeks previously. She was discharged from the hospital on Augmentin and Azithromycin.    Hospital Course:  Acute respiratory failure with hypoxia due to:  (was found in stool and cyanotic without oxygen on in her room at the SNF by daughter, who prompted immediate return to hospital)  A) Severe pulmonary HTN (PA pressure 95mm Hg / RHF / Severe Triscuspid regurgitation  This is likely the primary cause of her  decompensation, in conjunction with anxiety provoked by hypoxia - she is fully O2 dependent - given the severity of her pulm HTN,  the untreatable nature of this problem was discussed w/ daughter/POA, who agreed to a Palliative Care consult - Palliative Care has now evaluated the patient and all parties agree that transfer to a residential hospice home would be in the patient's best interest -she is currently stable from a respiratory standpoint without air hunger or anxiety -Pt has morphine allergy documented- actual type of reaction unknown- daughter also unsure of reaction but she will contact pt's PCP to clarify in the event Roxanol would be needed in the future  B) Right lower lobe pneumonia vs/ pneumonitis  Completed intended course of abx tx  - she is afebrile, and her WBC count is declining   C) COPD  Well compensated at this time   Acute C. Difficile colitis She had intermittent diarrhea over the past 48 hours.  - C. Diff PCR was positive on 10/21 and 10 days of Flagyl was initiated. Daughter also informed of this diagnosis.  CKD stage III  Renal function is stable with GFR ~60   ANEMIA-IRON DEFICIENCY  -hgb stable   Unspecified essential hypertension  Would NOT use diuretics in the pt with severe R heart failure unless she is clearly signif volume overloaded as she will be preload dependent and prone to hypotension with DH - of note, her PA pressure via TTE was noted to be normal in 2011, and is now severely elevated, indicating a marked progression in her disease  Recent Klebsiella UTI (lower urinary tract infection)  Monitor for signs of recrudescence   Alzheimer's disease  No agitation at this time   PVD   Procedures:  None  Consultations:  Palliative Care  Discharge Exam: Filed Vitals:   05/28/13 1116  BP: 113/48  Pulse: 80  Temp: 97.8 F (36.6 C)  Resp: 23   General: No acute respiratory distress at present -alert and talkative Lungs: Fine crackles  throughout all fields with no wheeze  Cardiovascular: Regular rate and rhythm without murmur gallop or rub  Abdomen: Obese, nontender, nondistended, soft, bowel sounds positive, no rebound, no ascites, no appreciable mass  Extremities: No significant cyanosis, clubbing, or edema bilateral lower extremities   Discharge Instructions      Discharge Orders   Future Orders Complete By Expires   Diet general  As directed    Scheduling Instructions:     Dysphagia 3 -thin liquids   Increase activity slowly  As directed        Medication List    STOP taking these medications       alendronate 70 MG tablet  Commonly known as:  FOSAMAX     amoxicillin-clavulanate 875-125 MG per tablet  Commonly known as:  AUGMENTIN     atorvastatin 10 MG tablet  Commonly known as:  LIPITOR     azithromycin 500 MG tablet  Commonly known as:  ZITHROMAX     Calcium Carb-Cholecalciferol 500-600 MG-UNIT Chew     chlorhexidine 0.12 % solution  Commonly known as:  PERIDEX     donepezil 5 MG tablet  Commonly known as:  ARICEPT     DULoxetine 60 MG capsule  Commonly known as:  CYMBALTA     fluticasone 50 MCG/ACT nasal spray  Commonly known as:  FLONASE     Fluticasone-Salmeterol 250-50 MCG/DOSE Aepb  Commonly known as:  ADVAIR     furosemide 40 MG tablet  Commonly known as:  LASIX     memantine 10 MG tablet  Commonly known as:  NAMENDA     metoprolol tartrate 25 MG tablet  Commonly known as:  LOPRESSOR     polyethylene glycol packet  Commonly known as:  MIRALAX / GLYCOLAX     spironolactone 50 MG tablet  Commonly known as:  ALDACTONE     Vitamin D-3 1000 UNITS Caps      TAKE these medications       acetaminophen 325 MG tablet  Commonly known as:  TYLENOL  Take 2 tablets (650 mg total) by mouth every 6 (six) hours as needed.     acetaminophen 650 MG suppository  Commonly known as:  TYLENOL  Place 1 suppository (650 mg total) rectally every 6 (six) hours as needed.      albuterol (5 MG/ML) 0.5% nebulizer solution  Commonly known as:  PROVENTIL  Take 0.5 mLs (2.5 mg total) by nebulization every 2 (two) hours as needed for wheezing or shortness of breath.     alum & mag hydroxide-simeth 200-200-20 MG/5ML suspension  Commonly known as:  MAALOX/MYLANTA  Take 30 mLs by mouth every 6 (six) hours as needed.     feeding supplement (RESOURCE BREEZE) Liqd  Take 60 mLs by mouth 2 (two) times daily.     ipratropium-albuterol 0.5-2.5 (3) MG/3ML Soln  Commonly known as:  DUONEB  Take 3 mLs by nebulization every 6 (six) hours as needed (for shortness of breath).     metroNIDAZOLE 500 MG tablet  Commonly known as:  FLAGYL  Take 1 tablet (500 mg total) by mouth every 8 (eight) hours.     ondansetron 4 MG tablet  Commonly known as:  ZOFRAN  Take 1 tablet (4 mg total) by mouth every 6 (six) hours as needed for nausea.     tiotropium 18 MCG inhalation capsule  Commonly known as:  SPIRIVA  Place 18 mcg into inhaler and inhale daily.       Allergies  Allergen Reactions  . Codeine Other (See Comments)    unknown  . Levaquin [Levofloxacin] Other (See Comments)    unknown  . Morphine Other (See Comments)    unknown   Follow-up Information   Please follow up. (No follow up indicated at this time- dc to [facility])        The results of significant diagnostics from this hospitalization (including imaging, microbiology, ancillary and laboratory) are listed below for reference.    Significant Diagnostic Studies: Ct Head Wo Contrast  05/20/2013   *RADIOLOGY REPORT*  Clinical Data: Loss of consciousness.  CT HEAD WITHOUT CONTRAST  Technique:  Contiguous axial images were obtained from the base of the skull through the vertex without contrast.  Comparison: CT of head March 06, 2011  Findings: The ventricles and sulci are overall normal for age.  No intraparenchymal hemorrhage, mass effect nor midline shift. Remote bilateral basal ganglial lacunar infarcts, with  mild ex vacuo dilatation of the left greater than right frontal horns.  Patchy supratentorial white matter hypodensities are within normal range for patient's age and though non-specific suggest sequalae of chronic small vessel ischemic disease. No acute large vascular territory infarcts.  No abnormal extra-axial fluid collections.  Basal cisterns are patent. Moderate calcific atherosclerosis of the carotid siphons and vertebral basilar system, similar.  No skull fracture.  Visualized paranasal sinuses and mastoid aircells are well-aerated.  The included ocular globes and orbital contents are non-suspicious.  IMPRESSION: No acute intracranial process.  Stable appearance of head:  Remote bilateral basal ganglia lacunar infarcts, mild to moderate white matter changes suggest chronic small vessel ischemic disease.   Original Report Authenticated By: Awilda Metro   Dg Chest Port 1 View  05/24/2013   CLINICAL DATA:  Shortness of breath.  EXAM: PORTABLE CHEST - 1 VIEW  COMPARISON:  05/21/2013.  FINDINGS: The heart is mildly enlarged but stable. The mediastinal and hilar contours are unchanged. Persistent right basilar opacity could reflect atelectasis or pneumonia. There are small bilateral pleural effusions.  IMPRESSION: Persistent right basilar opacity and small effusions.   Electronically Signed   By: Loralie Champagne M.D.   On: 05/24/2013 19:56   Dg Chest Port 1 View  05/21/2013   CLINICAL DATA:  Shortness of breath. Infiltrates.  EXAM: PORTABLE CHEST - 1 VIEW  COMPARISON:  05/20/2013  FINDINGS: New indistinct opacity at the right base. Mild cardiomegaly, similar to prior. Unchanged upper mediastinal contours. Small bilateral pleural effusions. No overt edema. No pneumothorax.  IMPRESSION: 1. New opacity at the right base which could represent atelectasis, aspiration, or developing infection. 2. Small pleural effusions.   Electronically Signed   By: Tiburcio Pea M.D.   On: 05/21/2013 06:32   Dg Chest  Portable 1 View  05/20/2013   *RADIOLOGY REPORT*  Clinical Data: Loss of consciousness, respiratory distress.  PORTABLE CHEST - 1 VIEW  Comparison: Chest radiograph March 05, 2011  Findings: Cardiac silhouette appears moderately enlarged, similar. Mildly calcified aortic knob.  Mediastinal silhouette is not suspicious.  Mild chronic interstitial changes similar with blunting  of the left costophrenic angle, unchanged.  Central pulmonary vascular engorgement.  No pneumothorax though, lung apices obscured by facial structures.  Multiple EKG lines overlay the patient and could obscure underlying subtle pathology.  Soft tissue planes and included osseous structures are not suspicious.  IMPRESSION: Stable cardiomegaly and central pulmonary vasculature congestion. Mild chronic interstitial changes, blunting of the left costophrenic angle favors pleural thickening as this is similar to prior examination.   Original Report Authenticated By: Awilda Metro   Dg Swallowing Func-speech Pathology  05/22/2013   Carolan Shiver, CCC-SLP     05/22/2013  4:54 PM Objective Swallowing Evaluation: Modified Barium Swallowing Study   Patient Details  Name: BAILLIE MOHAMMAD MRN: [id number] Date of Birth: [birth year]  Today's Date: 05/22/2013 Time: 1350-1430 SLP Time Calculation (min): 40 min  Past Medical History:  Past Medical History  Diagnosis Date  . Depression   . Hypertension   . CHF (congestive heart failure)   . Alzheimer's disease   . Edema   . 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 No pertinent past  surgical history. HPI:  79 y.o. female admitted to [facility] on 05/20/13 syncope, questionable  cardiac arrest with return to spontaneous circulation after 6  minutes. Respiratory failure due to RLL PNA (? aspiration). Note  that infiltrate was noted on f/u, not on original admission CXR.   On clinical swallow eval this am, pt presented with  relatively  functional swallow, but given hx of recurrent PNAs, acute RLL  PNA, h/o COPD, and h/o dementia, instrumental swallow study was  recommended.     Assessment / Plan / Recommendation Clinical Impression  Dysphagia Diagnosis: Within Functional Limits  Clinical impression: Pt's swallow function was [facility] with noted  occasional, high penetration of thin liquids into larynx.  Thins  were ejected from larynx upon completion of swallow response.   Propulsion of material and passage through pharynx was normal.   Esophageal screen revealed barium stasis in distal esophagus with  noted backflow.    Pt was observed with lunch meal after study.  Functionally, there  were concerns not evident during MBS when pt was breathing  comfortably.   RR was in mid 30s and Sp02 fluctuated between  88-90, creating greater potential for aspiration due to  compromised timing of swallow-respiratory cycle.   Pt required  reminders to slow rate and allow rest breaks.  No overt coughing  noted, however.   Recommend continuing with regular consistency diet and thin  liquids, meds whole with liquid.   Will f/u briefly for pt/family  education re: respiration and swallowing safety.         Treatment Recommendation  Therapy as outlined in treatment plan below    Diet Recommendation Regular;Thin liquid   Liquid Administration via: Cup;Straw Medication Administration: Whole meds with liquid Supervision: Patient able to self feed;Intermittent supervision  to cue for compensatory strategies Compensations: Slow rate;Small sips/bites (take breaks when SOB  or RR exceeds 30) Postural Changes and/or Swallow Maneuvers: Seated upright 90  degrees    Other  Recommendations Oral Care Recommendations: Oral care BID   Follow Up Recommendations  None    Frequency and Duration min 1 x/week  1 week   Pertinent Vitals/Pain No pain     General Date of Onset: 05/20/13 Type of Study: Modified Barium Swallowing Study Reason for Referral: Objectively evaluate  swallowing function Previous Swallow Assessment:  (clinical swallow eval 05/22/13) Diet Prior  to this Study: Dysphagia 3 (soft);Thin liquids Temperature Spikes Noted: No Respiratory Status: Nasal cannula History of Recent Intubation: No Behavior/Cognition: Alert;Cooperative;Pleasant mood Oral Cavity - Dentition: Adequate natural dentition Oral Motor / Sensory Function: Within functional limits Self-Feeding Abilities: Able to feed self Patient Positioning: Upright in bed Baseline Vocal Quality: Clear Volitional Cough: Weak Volitional Swallow: Able to elicit Anatomy: Within functional limits Pharyngeal Secretions: Not observed secondary MBS    Reason for Referral Objectively evaluate swallowing function   Oral Phase Oral Preparation/Oral Phase Oral Phase: WFL   Pharyngeal Phase Pharyngeal Phase Pharyngeal Phase: Impaired Pharyngeal - Thin Pharyngeal - Thin Straw: Penetration/Aspiration during  swallow;Premature spillage to pyriform sinuses Penetration/Aspiration details (thin straw): Material enters  airway, remains ABOVE vocal cords then ejected out  Cervical Esophageal Phase    GO    Cervical Esophageal Phase Cervical Esophageal Phase: Impaired Cervical Esophageal Phase - Comment Cervical Esophageal Comment:  (screen of esophagus revealed  barium stasis distally)        Amanda L. Samson Frederic, [place] CCC/SLP Pager [phone]  Blenda Mounts Laurice 05/22/2013, 4:42 PM     Microbiology: Recent Results (from the past 240 hour(s))  CULTURE, BLOOD (ROUTINE X 2)     Status: None   Collection Time    05/20/13  1:30 AM      Result Value Range Status   Specimen Description BLOOD LEFT ARM   Final   Special Requests BOTTLES DRAWN AEROBIC ONLY 3.5CC   Final   Culture  Setup Time     Final   Value: 05/20/2013 09:13     Performed at [facility]   Culture     Final   Value: NO GROWTH 5 DAYS     Performed at [facility]   Report Status 05/26/2013 FINAL   Final  CULTURE, BLOOD (ROUTINE X 2)     Status:  None   Collection Time    05/20/13  1:55 AM      Result Value Range Status   Specimen Description BLOOD RIGHT HAND   Final   Special Requests BOTTLES DRAWN AEROBIC AND ANAEROBIC 10CC   Final   Culture  Setup Time     Final   Value: 05/20/2013 09:13     Performed at [facility]   Culture     Final   Value: NO GROWTH 5 DAYS     Performed at [facility]   Report Status 05/26/2013 FINAL   Final  URINE CULTURE     Status: None   Collection Time    05/20/13  2:29 AM      Result Value Range Status   Specimen Description URINE, CATHETERIZED   Final   Special Requests ADDED 0302   Final   Culture  Setup Time     Final   Value: 05/20/2013 03:42     Performed at [facility]   Colony Count     Final   Value: >=100,000 COLONIES/ML     Performed at [facility]   Culture     Final   Value: KLEBSIELLA PNEUMONIAE     Performed at [facility]   Report Status 05/21/2013 FINAL   Final   Organism ID, Bacteria KLEBSIELLA PNEUMONIAE   Final  MRSA PCR SCREENING     Status: None   Collection Time    05/20/13  5:35 AM      Result Value Range Status   MRSA by PCR NEGATIVE  NEGATIVE Final  Comment:            The GeneXpert MRSA Assay (FDA     approved for NASAL specimens     only), is one component of a     comprehensive MRSA colonization     surveillance program. It is not     intended to diagnose MRSA     infection nor to guide or     monitor treatment for     MRSA infections.  CLOSTRIDIUM DIFFICILE BY PCR     Status: Abnormal   Collection Time    05/28/13  7:00 AM      Result Value Range Status   C difficile by pcr POSITIVE (*) NEGATIVE Final   Comment: CRITICAL RESULT CALLED TO, READ BACK BY AND VERIFIED WITH:     T.SAVAGE,RN 05/28/13 0917 BY BSLADE     Labs: Basic Metabolic Panel:  Recent Labs Lab 05/23/13 0615 05/24/13 0400 05/24/13 1005 05/24/13 1907 05/25/13 0358  NA 138 136 135 140 137  K 3.7 3.4* 3.5 4.8 4.6  CL 100 99 98  103 99  CO2 28 27 27 25 26   GLUCOSE 118* 94 128* 118* 152*  BUN 18 16 17 19  24*  CREATININE 0.66 0.64 0.63 0.81 0.92  CALCIUM 8.3* 8.2* 8.5 8.4 8.6   Liver Function Tests:  Recent Labs Lab 05/22/13 0521 05/24/13 1907  AST 42* 17  ALT 121* 56*  ALKPHOS 72 66  BILITOT 0.8 0.8  PROT 6.3 6.5  ALBUMIN 2.6* 2.5*   No results found for this basename: LIPASE, AMYLASE,  in the last 168 hours No results found for this basename: AMMONIA,  in the last 168 hours CBC:  Recent Labs Lab 05/23/13 0615 05/24/13 1907 05/25/13 0358  WBC 15.9* 17.7* 10.7*  NEUTROABS  --  14.5*  --   HGB 10.6* 11.2* 10.8*  HCT 33.9* 36.3 34.5*  MCV 98.5 98.4 98.0  PLT 264 319 254   Cardiac Enzymes:  Recent Labs Lab 05/24/13 1907  TROPONINI <0.30   BNP: BNP (last 3 results)  Recent Labs  05/20/13 0122 05/24/13 1907  PROBNP 16109.6* 12808.0*   CBG:  Recent Labs Lab 05/26/13 0828  GLUCAP 127*       Signed:  Lain Tetterton ANP [facility] 05/28/2013, 2:00 PM   I have examined the patient, reviewed the chart and modified the above note which I agree with.   Evangelene Vora,MD [phone] 05/28/2013, 2:00 PM

## 2013-05-28 NOTE — Consult Note (Addendum)
Patient ID:Patricia Roberson      DOB: [birth year]      ZOX:[id number]     Consult Note from the Palliative Medicine Team at [facility]    Consult Requested by: Dr. Sharon Seller     PCP: No PCP Per Patient Reason for Consultation: Goals of care    Phone Number:[phone] Related symptom recommendations Assessment of patients Current state: Patient is an 79 year old white female who was recently hospitalized for treatment of UTI sepsis pneumonia and CHF. She was returned to her assisted living facility and returned with hypoxia and cyanosis. Patient has had increasing episodes of worsening dyspnea and confusion. We attempted to meet with the patient's daughter to assess her understanding of her medical illness but she was guarded and unwilling to participate in the interview. I spoke with the patient's son-in-law and daughter who expressed at this time they would like to focus completely on comfort and understanding that she may pass away in the near future related to her advanced COPD. Please see the additional information of my note on the surface. At this time,to pursue residential hospice placement with discontinuation of all curative treatment in order to focus on full comfort.   Goals of Care: 1.  Code Status: Family desires to convert to DO NOT RESUSCITATE   2. Scope of Treatment: Family desires to continue treatments at this time until the patient is discharged and then they understand that no further antibiotics IV fluids or tube feedings will be initiated  4. Disposition: Transition to residential hospice home on a bed available. With that she qualifies under her pants COPD with continued aspiration, hypoxia and alterations in mental status.   3. Symptom Management:   1. Anxiety/Agitation: Continue Cymbalta, when necessary Ativan, Namenda and Aricept for now may consider discontinuation on discharge if felt to appropriate 2. Pain: Agree with OxyContin immediate release which can be  comforted to liquid if patient is having difficulty taking pills 3. Bowel Regimen: Monitor for additional medications while on opiates 4. Dyspnea continue Spiriva nebulizers and opiates when necessary shortness of breath  4. Psychosocial: Patient is described as a very proud  5. Spiritual: Family appreciates availability of chaplains        Patient Documents Completed or Given: Document Given Completed  Advanced Directives Pkt    MOST    DNR    Gone from My Sight    Hard Choices      Brief HPI: 79 year old white female readmitted to the hospital shortly after being returned to her skilled nursing facility with increased shortness of breath. Patient has had worsening episodes of shortness of breath and hypoxia. We've been asked to assist with goals of care   ROS: Patient unwilling to provide information and review of systems    PMH:  Past Medical History  Diagnosis Date  . Depression   . Hypertension   . CHF (congestive heart failure)   . Alzheimer's disease   . Edema   . Dementia in conditions classified elsewhere with behavioral disturbance(294.11)      FAO:ZHYQMVH reviewed. No pertinent past surgical history. I have reviewed the FH and SH and  If appropriate update it with new information. Allergies  Allergen Reactions  . Codeine Other (See Comments)    unknown  . Levaquin [Levofloxacin] Other (See Comments)    unknown  . Morphine Other (See Comments)    unknown   Scheduled Meds: Continuous Infusions: PRN Meds:.      BP 113/48  Pulse 80  Temp(Src) 97.8 F (36.6 C) (Oral)  Resp 23  Ht 5\' 4"  (1.626 m)  Wt 84.4 kg (186 lb 1.1 oz)  BMI 31.92 kg/m2  SpO2 98%   PPS: 30-40%   Intake/Output Summary (Last 24 hours) at 05/28/13 1844 Last data filed at 05/28/13 0900  Gross per 24 hour  Intake    240 ml  Output      0 ml  Net    240 ml    Physical Exam:  General: Guarded but pleasant, not willing to answer direct questions not able to  articulate answers that are open ended HEENT:  Pupils are equal round and reactive to light, extraocular muscles appear intact his membranes are moist speech is fluent and clear but guarded Chest:   Decreased with end expiratory wheeze bilaterally, person breathing noted CVS: Regular rate and rhythm positive S1 and S2 I don't appreciate an S3 or S4 Abdomen: Soft nontender nondistended with positive bowel sounds no hepatosplenomegaly Ext: Trace nonpitting edema multiple areas of ecchymosis Neuro: Patient is guarded in her speech which has the covers dementia. She is not oriented to time or place she does know her daughter however.  Labs: CBC    Component Value Date/Time   WBC 10.7* 05/25/2013 0358   WBC 10.6 03/25/2013   RBC 3.52* 05/25/2013 0358   HGB 10.8* 05/25/2013 0358   HCT 34.5* 05/25/2013 0358   PLT 254 05/25/2013 0358   MCV 98.0 05/25/2013 0358   MCH 30.7 05/25/2013 0358   MCHC 31.3 05/25/2013 0358   RDW 18.2* 05/25/2013 0358   LYMPHSABS 0.9 05/24/2013 1907   MONOABS 2.2* 05/24/2013 1907   EOSABS 0.1 05/24/2013 1907   BASOSABS 0.0 05/24/2013 1907       CMP     Component Value Date/Time   NA 137 05/25/2013 0358   NA 141 03/25/2013   K 4.6 05/25/2013 0358   CL 99 05/25/2013 0358   CO2 26 05/25/2013 0358   GLUCOSE 152* 05/25/2013 0358   BUN 24* 05/25/2013 0358   BUN 22* 03/25/2013   CREATININE 0.92 05/25/2013 0358   CREATININE 1.1 03/25/2013   CALCIUM 8.6 05/25/2013 0358   PROT 6.5 05/24/2013 1907   ALBUMIN 2.5* 05/24/2013 1907   AST 17 05/24/2013 1907   ALT 56* 05/24/2013 1907   ALKPHOS 66 05/24/2013 1907   BILITOT 0.8 05/24/2013 1907   GFRNONAA 56* 05/25/2013 0358   GFRAA 64* 05/25/2013 0358    Chest Xray Reviewed/Impressions: Basilar opacity with pleural effusion  CT scan of the Head Reviewed/Impressions: Remote bibasilar team The or infarcts, mild to moderate white matter changes    Time In Time Out Total Time Spent with Patient Total Overall Time   8:40 AM   10:05 AM   20 minute  85 minutes     Greater than 50%  of this time was spent counseling and coordinating care related to the above assessment and plan.  Dashonna Chagnon L. Ladona Ridgel, MD MBA The Palliative Medicine Team at [facility] Phone: [phone] Pager: [phone]

## 2013-05-28 NOTE — Consult Note (Signed)
[facility] Liaison: [employer] available for Patricia Roberson today, 05/28/2013. Met with patient and daughter yesterday, transfer paperwork completed by daughter/HCPOA. Dr. Kern Reap to assume care per daughter's request. Daughter expressed much appreciation for Dr. Lonia Blood and nurses caring for Ms. Yetta Barre. Please fax discharge summary to [phone] and have RN call report to [phone]. Please arrange ambulance transport for Ms. Stotts to arrive at [facility] by noon. Thank you. Forrestine Him LCSW [phone]

## 2013-05-28 NOTE — Clinical Social Work Note (Signed)
CSW has compiled discharge packet, alerted nurse, and arranged for transportation. Pt to be discharged to [facility].   Maryclare Labrador, MSW, [facility] Clinical Social Worker [phone]

## 2013-06-21 ENCOUNTER — Non-Acute Institutional Stay (SKILLED_NURSING_FACILITY): Payer: Medicare Other | Admitting: Nurse Practitioner

## 2013-06-21 DIAGNOSIS — R609 Edema, unspecified: Secondary | ICD-10-CM

## 2013-06-21 DIAGNOSIS — F0280 Dementia in other diseases classified elsewhere without behavioral disturbance: Secondary | ICD-10-CM

## 2013-06-21 DIAGNOSIS — A0472 Enterocolitis due to Clostridium difficile, not specified as recurrent: Secondary | ICD-10-CM

## 2013-06-21 DIAGNOSIS — I2789 Other specified pulmonary heart diseases: Secondary | ICD-10-CM

## 2013-06-21 DIAGNOSIS — I272 Pulmonary hypertension, unspecified: Secondary | ICD-10-CM

## 2013-06-21 DIAGNOSIS — J449 Chronic obstructive pulmonary disease, unspecified: Secondary | ICD-10-CM

## 2013-06-21 DIAGNOSIS — I10 Essential (primary) hypertension: Secondary | ICD-10-CM

## 2013-06-22 ENCOUNTER — Encounter: Payer: Self-pay | Admitting: Nurse Practitioner

## 2013-06-22 DIAGNOSIS — A0472 Enterocolitis due to Clostridium difficile, not specified as recurrent: Secondary | ICD-10-CM | POA: Insufficient documentation

## 2013-06-22 DIAGNOSIS — I272 Pulmonary hypertension, unspecified: Secondary | ICD-10-CM | POA: Insufficient documentation

## 2013-06-22 NOTE — Assessment & Plan Note (Signed)
No apparent.  ?

## 2013-06-22 NOTE — Progress Notes (Signed)
Patient ID: Patricia Roberson, female   DOB: 1926/09/26, 79 y.o.   MRN: [id number]  Code Status: DNR  Allergies  Allergen Reactions  . Codeine Other (See Comments)    unknown  . Levaquin [Levofloxacin] Other (See Comments)    unknown  . Morphine Other (See Comments)    unknown    Chief Complaint  Patient presents with  . Medical Managment of Chronic Issues    comfort care, C-diff colitis    HPI: Patient is a 79 y.o. female seen in the SNF at [facility] today for evaluation of end of life care with comfort measures. She was admitted to hospital 05/20/13 and 05/24/13. 05/20/13-05/24/13  the patient was brought by EMS after collapsing and received CPR for 6 minutes for questionable cardiac arrest. At arrival breathing spontaneously on face mask but hypoxic. Acute respiratory failure with hypoxia due to Apiration pneumonitis and COPD (chronic obstructive pulmonary disease): CXR with RLL opacity, treated with Azithro and  Augmentin. Severe pulmonary HTN/RHF: mod/severe TR with RV dilatation, by echo on 05/20/2013 unchanged from 2011, diuretics was resumed. Klebsiella UTI (lower urinary tract infection) Urine culture shows sensitive to penicillins, Augmentin should cover. 05/24/13-05/28/13 readmitted to hospital after she was found an hour later after returned from hospital cyanotic and tachypnea in her room. During the hospital stay she was treated for Acute respiratory failure due to HCAP (healthcare-associated pneumonia), COPD (chronic obstructive pulmonary disease)/Severe pulmonary HTN (95 mmHg), C. Difficile colitis, Chronic diastolic heart failure. She was then discharged to [facility] for continued Hospice care. Subsequently she was transferred to SNF@[facility]  where she resided.       Problem List Items Addressed This Visit   Alzheimer's disease     No agitation presently.     CKD (chronic kidney disease), stage III     Renal function is stable with GFR ~60      Colitis due to  Clostridium difficile      C. Diff PCR was positive on 10/21 and 10 days of Flagyl was completed. Daughter also informed of this diagnosis.     COPD (chronic obstructive pulmonary disease)     Compensated with O2 via [place]    Edema     No apparent.     Pulmonary hypertension - Primary     Severe pulmonary HTN (PA pressure 95mm Hg / RHF / Severe Triscuspid regurgitation  This is likely the primary cause of her decompensation, in conjunction with anxiety provoked by hypoxia - she is fully O2 dependent, given the severity of her pulm HTN, the untreatable nature of this problem-the patient is under Hospice Care      Unspecified essential hypertension     Would NOT use diuretics in the pt with severe R heart failure unless she is clearly signif volume overloaded as she will be preload dependent and prone to hypotension.         Review of Systems:  Review of Systems  Constitutional: Positive for malaise/fatigue. Negative for fever, chills, weight loss and diaphoresis.       Generalized weakness-bedrest.   HENT: Positive for hearing loss (mild). Negative for congestion, ear pain and sore throat.   Eyes: Negative for pain, discharge and redness.  Respiratory: Positive for cough (chronic) and shortness of breath. Negative for sputum production and wheezing.   Cardiovascular: Positive for leg swelling (trace in ankles, L>R). Negative for chest pain, palpitations, orthopnea, claudication and PND.       Trace edema   Gastrointestinal: Positive  for diarrhea. Negative for heartburn, nausea, vomiting, abdominal pain and constipation.  Genitourinary: Positive for frequency (incontinent). Negative for dysuria, urgency and flank pain.  Musculoskeletal: Positive for back pain. Negative for falls, joint pain and neck pain.  Skin: Negative for itching and rash.  Neurological: Positive for weakness. Negative for dizziness, tremors, sensory change, speech change, focal weakness, seizures and loss of  consciousness.  Endo/Heme/Allergies: Negative for environmental allergies and polydipsia. Does not bruise/bleed easily.  Psychiatric/Behavioral: Positive for memory loss. Negative for depression and hallucinations. The patient is not nervous/anxious and does not have insomnia.      Past Medical History  Diagnosis Date  . Depression   . Hypertension   . CHF (congestive heart failure)   . Alzheimer's disease   . Edema   . Dementia in conditions classified elsewhere with behavioral disturbance(294.11)    Medications:   Medication List       This list is accurate as of: 06/21/13 11:59 PM.  Always use your most recent med list.               acetaminophen 325 MG tablet  Commonly known as:  TYLENOL  Take 2 tablets (650 mg total) by mouth every 6 (six) hours as needed.     acetaminophen 650 MG suppository  Commonly known as:  TYLENOL  Place 1 suppository (650 mg total) rectally every 6 (six) hours as needed.     albuterol (5 MG/ML) 0.5% nebulizer solution  Commonly known as:  PROVENTIL  Take 0.5 mLs (2.5 mg total) by nebulization every 2 (two) hours as needed for wheezing or shortness of breath.     alum & mag hydroxide-simeth 200-200-20 MG/5ML suspension  Commonly known as:  MAALOX/MYLANTA  Take 30 mLs by mouth every 6 (six) hours as needed.     feeding supplement (RESOURCE BREEZE) Liqd  Take 60 mLs by mouth 2 (two) times daily.     ipratropium-albuterol 0.5-2.5 (3) MG/3ML Soln  Commonly known as:  DUONEB  Take 3 mLs by nebulization every 6 (six) hours as needed (for shortness of breath).     ondansetron 4 MG tablet  Commonly known as:  ZOFRAN  Take 1 tablet (4 mg total) by mouth every 6 (six) hours as needed for nausea.     tiotropium 18 MCG inhalation capsule  Commonly known as:  SPIRIVA  Place 18 mcg into inhaler and inhale daily.         Physical Exam: Physical Exam  Constitutional: She appears well-developed and well-nourished. No distress.  Comfortably  resting in bed with head of bed elevated 30 degrees and O2 @ 3lmp via [place]  HENT:  Head: Normocephalic and atraumatic.  Eyes: Conjunctivae and EOM are normal. Pupils are equal, round, and reactive to light.  Neck: Normal range of motion. Neck supple. No JVD present.  Cardiovascular: Normal rate, regular rhythm and normal heart sounds.   No murmur heard. Trace edema in ankles L>R  Pulmonary/Chest: Effort normal. No respiratory distress. She has no wheezes. She has rales (dry bibasilar).  O2 dependent.   Abdominal: Soft. Bowel sounds are normal. There is no tenderness.  Musculoskeletal: Normal range of motion. She exhibits no edema and no tenderness.  Lymphadenopathy:    She has no cervical adenopathy.  Neurological: She is alert. She has normal reflexes. No cranial nerve deficit. She exhibits normal muscle tone. Coordination normal.  Skin: Skin is warm and dry. No rash noted. No erythema.  Psychiatric: Her speech is normal. Her affect  is blunt and inappropriate. She is agitated. Thought content is not paranoid and not delusional. Cognition and memory are impaired. She expresses impulsivity. She expresses no suicidal ideation.    Filed Vitals:   06/21/13 1741  BP: 122/76  Pulse: 86  Temp: 97.2 F (36.2 C)  TempSrc: Tympanic  Resp: 22   Labs reviewed: Basic Metabolic Panel:  Recent Labs  16/10/96 02/26/13  05/20/13 0155  05/24/13 1005 05/24/13 1907 05/25/13 0358  NA 139 139  < >  --   < > 135 140 137  K 4.5 4.3  < >  --   < > 3.5 4.8 4.6  CL  --   --   < >  --   < > 98 103 99  CO2  --   --   < >  --   < > 27 25 26   GLUCOSE  --   --   < >  --   < > 128* 118* 152*  BUN 20 21  < >  --   < > 17 19 24*  CREATININE 1.0 1.0  < >  --   < > 0.63 0.81 0.92  CALCIUM  --   --   < >  --   < > 8.5 8.4 8.6  MG  --   --   --  2.1  --   --   --   --   TSH 0.38* 0.57  --   --   --   --   --   --   < > = values in this interval not displayed. Liver Function Tests:  Recent Labs   05/20/13 0122 05/22/13 0521 05/24/13 1907  AST 169* 42* 17  ALT 207* 121* 56*  ALKPHOS 73 72 66  BILITOT 1.1 0.8 0.8  PROT 6.9 6.3 6.5  ALBUMIN 3.0* 2.6* 2.5*   CBC:  Recent Labs  05/20/13 0122  05/23/13 0615 05/24/13 1907 05/25/13 0358  WBC 31.8*  < > 15.9* 17.7* 10.7*  NEUTROABS 28.9*  --   --  14.5*  --   HGB 12.2  < > 10.6* 11.2* 10.8*  HCT 37.2  < > 33.9* 36.3 34.5*  MCV 98.9  < > 98.5 98.4 98.0  PLT 301  < > 264 319 254  < > = values in this interval not displayed. Lipid Panel:  Recent Labs  08/16/12  HDL 47  LDLCALC 53  TRIG 39*   Past Procedures:  08/14/12 CXR no evidence of acute or residual infiltrate is seen.   04/30/13 CXR patchy bibasilar atelectasis or pneumonitis. Small left pleural effusion. Borderline cardiomegaly unchanged with new mild pulmonary vascular congestion.   05/01/13 BLE arterial US: significant arterial stenosis   05/20/2013 *RADIOLOGY REPORT* Clinical Data: Loss of consciousness. CT HEAD WITHOUT CONTRAST  IMPRESSION: No acute intracranial process. Stable appearance of head: Remote bilateral basal ganglia lacunar infarcts, mild to moderate white matter changes suggest chronic small vessel ischemic disease.   05/24/2013 CLINICAL DATA: Shortness of breath. EXAM: PORTABLE CHEST - IMPRESSION: Persistent right basilar opacity and small effusions.   05/21/2013 CLINICAL DATA: Shortness of breath. Infiltrates. EXAM: PORTABLE CHEST: IMPRESSION: 1. New opacity at the right base which could represent atelectasis, aspiration, or developing infection. 2. Small pleural effusions.  05/20/2013 *RADIOLOGY REPORT* Clinical Data: Loss of consciousness, respiratory distress. PORTABLE CHEST - IMPRESSION: Stable cardiomegaly and central pulmonary vasculature congestion. Mild chronic interstitial changes, blunting of the left costophrenic angle favors pleural thickening as this  is similar  Assessment/Plan Pulmonary hypertension Severe pulmonary HTN (PA pressure  95mm Hg / RHF / Severe Triscuspid regurgitation  This is likely the primary cause of her decompensation, in conjunction with anxiety provoked by hypoxia - she is fully O2 dependent, given the severity of her pulm HTN, the untreatable nature of this problem-the patient is under Hospice Care    COPD (chronic obstructive pulmonary disease) Compensated with O2 via [place]  Colitis due to Clostridium difficile  C. Diff PCR was positive on 10/21 and 10 days of Flagyl was completed. Daughter also informed of this diagnosis.   CKD (chronic kidney disease), stage III Renal function is stable with GFR ~60    Unspecified essential hypertension Would NOT use diuretics in the pt with severe R heart failure unless she is clearly signif volume overloaded as she will be preload dependent and prone to hypotension.    Alzheimer's disease No agitation presently.   Edema No apparent.     Family/ Staff Communication: observe the patient.   Goals of Care: SNF. Hospice Service. Comfort measures.   Labs/tests ordered: none

## 2013-06-22 NOTE — Assessment & Plan Note (Signed)
No agitation presently.

## 2013-06-22 NOTE — Assessment & Plan Note (Signed)
Compensated with O2 via [place]

## 2013-06-22 NOTE — Assessment & Plan Note (Signed)
Severe pulmonary HTN (PA pressure 95mm Hg / RHF / Severe Triscuspid regurgitation  This is likely the primary cause of her decompensation, in conjunction with anxiety provoked by hypoxia - she is fully O2 dependent, given the severity of her pulm HTN, the untreatable nature of this problem-the patient is under Hospice Care

## 2013-06-22 NOTE — Assessment & Plan Note (Signed)
C. Diff PCR was positive on 10/21 and 10 days of Flagyl was completed. Daughter also informed of this diagnosis.

## 2013-06-22 NOTE — Assessment & Plan Note (Signed)
Renal function is stable with GFR ~60

## 2013-06-22 NOTE — Assessment & Plan Note (Signed)
Would NOT use diuretics in the pt with severe R heart failure unless she is clearly signif volume overloaded as she will be preload dependent and prone to hypotension.

## 2013-08-08 NOTE — Progress Notes (Signed)
Occupational Therapy Evaluation Addendum:   05/22/13 1000  OT G-codes **NOT FOR INPATIENT CLASS**  Functional Limitation Self care  Self Care Current Status (E9528(G8987) CL  Self Care Goal Status (U1324(G8988) CK   Jeani HawkingWendi Juliah Scadden, OTR/L [phone]

## 2013-08-08 DEATH — deceased

## 2013-08-09 NOTE — Progress Notes (Signed)
Late entry _ SLP swallow eval addendum  05/22/13 1400  SLP G-Codes **NOT FOR INPATIENT CLASS**  Functional Assessment Tool Used clinical judgement  Functional Limitations Swallowing  Swallow Current Status (W0981(G8996) CI  Swallow Goal Status (X9147(G8997) CI  Swallow Discharge Status (W2956(G8998) CI

## 2013-08-09 NOTE — Progress Notes (Addendum)
05/21/13 1543  PT G-Codes **NOT FOR INPATIENT CLASS**  Functional Assessment Tool Used assist level  Functional Limitation Mobility: Walking and moving around  Mobility: Walking and Moving Around Current Status (Y8657(G8978) CJ  Mobility: Walking and Moving Around Goal Status (Q4696(G8979) CI  Late entry g-codes for eval dated 05/21/13.   Rollene Rotundaebecca B. Hollis Tuller, PT, DPT [phone]

## 2015-01-14 IMAGING — DX DG CHEST 1V PORT
1 series · 1 of 1 positions shown · non-contrast
Comparison: 05/20/2013

CLINICAL DATA: Shortness of breath. Infiltrates.

EXAM:
PORTABLE CHEST - 1 VIEW

[portable]
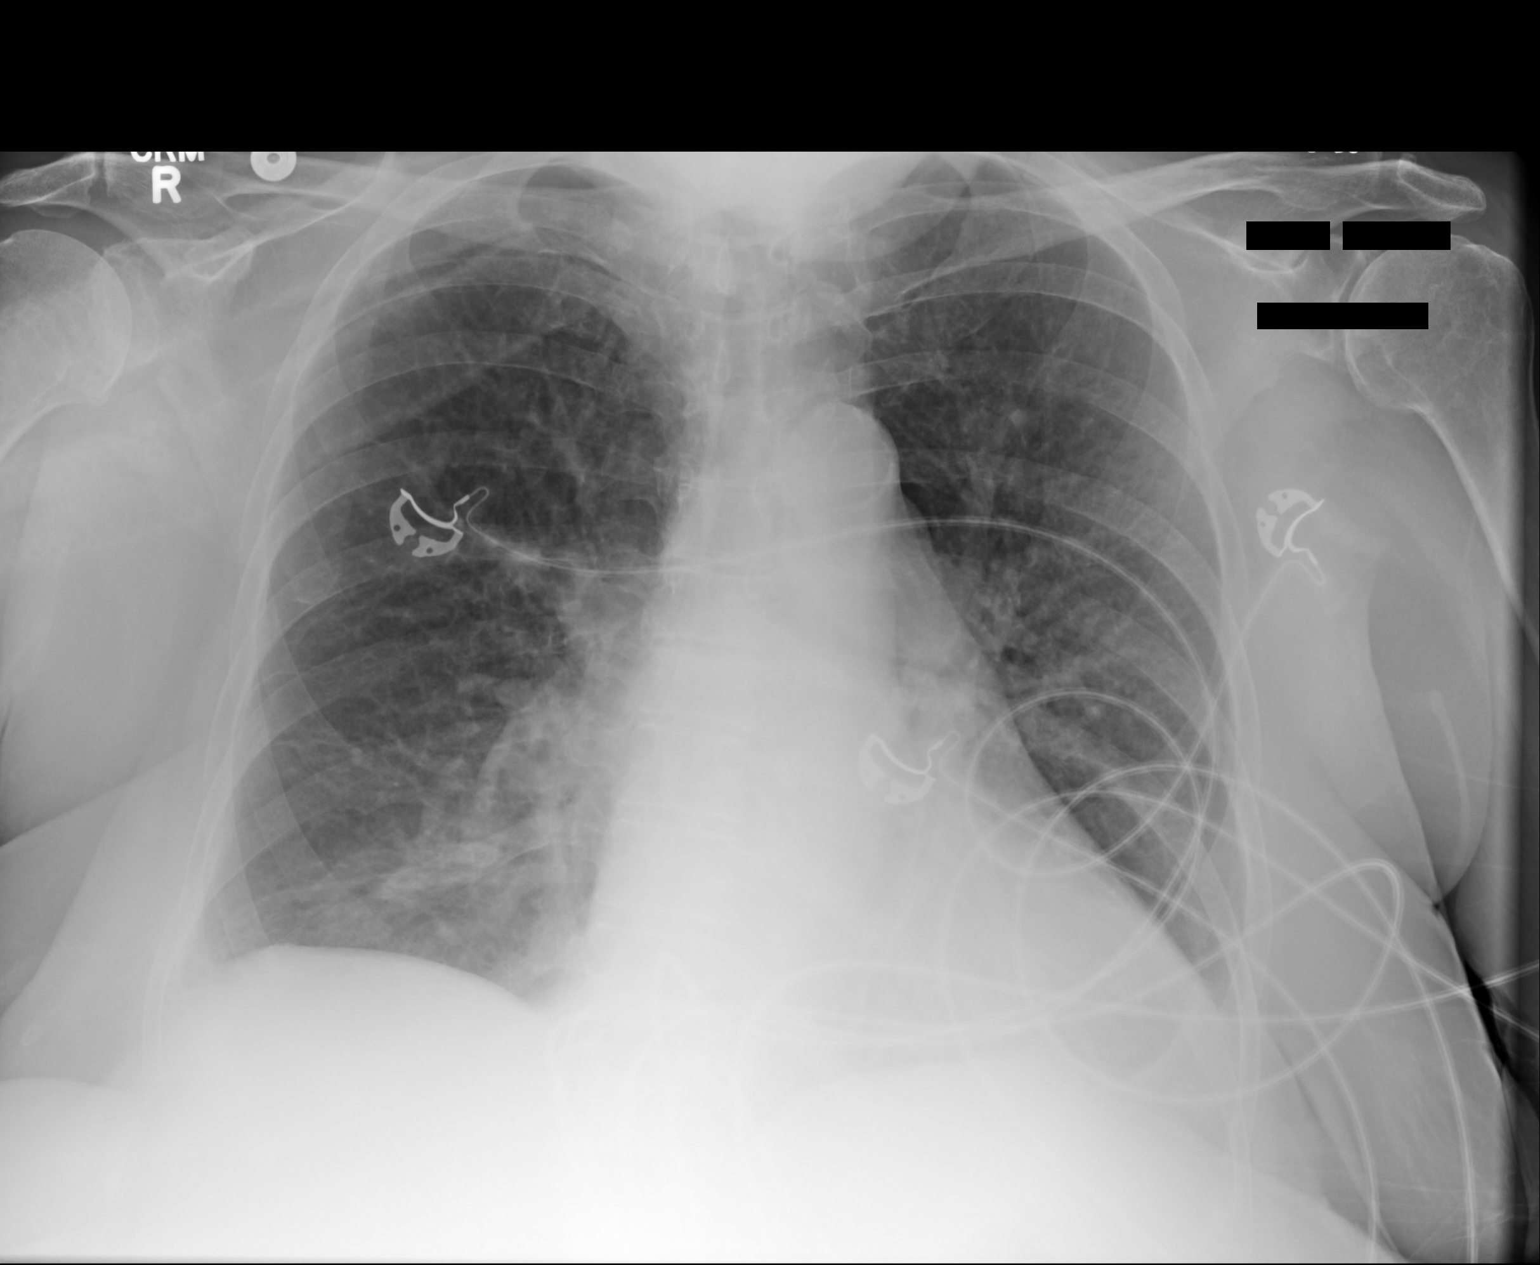

[1 of 1 positions shown; findings below may reference images not displayed]

FINDINGS: New indistinct opacity at the right base. Mild cardiomegaly, similar
to prior. Unchanged upper mediastinal contours. Small bilateral
pleural effusions. No overt edema. No pneumothorax.
IMPRESSION: 1. New opacity at the right base which could represent atelectasis,
aspiration, or developing infection.
2. Small pleural effusions.

## 2015-06-18 NOTE — Progress Notes (Signed)
This encounter was created in error - please disregard.
